# Patient Record
Sex: Female | Born: 1990 | Race: Black or African American | Hispanic: No | Marital: Single | Smoking: Never smoker
Health system: Southern US, Community
[De-identification: ages and names within clinical notes are randomized; demographics above are authoritative.]

---

## 2013-11-06 ENCOUNTER — Emergency Department (HOSPITAL_BASED_OUTPATIENT_CLINIC_OR_DEPARTMENT_OTHER)
Admission: EM | Admit: 2013-11-06 | Discharge: 2013-11-06 | Disposition: A | Discharge status: Home / Self Care | Payer: Self-pay | Attending: Emergency Medicine | Admitting: Emergency Medicine

## 2013-11-06 ENCOUNTER — Encounter (HOSPITAL_BASED_OUTPATIENT_CLINIC_OR_DEPARTMENT_OTHER): Payer: Self-pay | Admitting: Emergency Medicine

## 2013-11-06 ENCOUNTER — Emergency Department (HOSPITAL_BASED_OUTPATIENT_CLINIC_OR_DEPARTMENT_OTHER): Payer: Self-pay

## 2013-11-06 DIAGNOSIS — S6980XA Other specified injuries of unspecified wrist, hand and finger(s), initial encounter: Secondary | ICD-10-CM | POA: Insufficient documentation

## 2013-11-06 DIAGNOSIS — S6990XA Unspecified injury of unspecified wrist, hand and finger(s), initial encounter: Secondary | ICD-10-CM | POA: Insufficient documentation

## 2013-11-06 DIAGNOSIS — S62309A Unspecified fracture of unspecified metacarpal bone, initial encounter for closed fracture: Secondary | ICD-10-CM | POA: Insufficient documentation

## 2013-11-06 MED ORDER — NAPROXEN 500 MG PO TABS: 500.0000 mg | ORAL_TABLET | Freq: Two times a day (BID) | ORAL | Status: DC
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
## 2013-11-06 NOTE — Discharge Instructions (Signed)
Metacarpal Fractures Fractures of metacarpals are breaks in the bones of the hand. They extend from the knuckles to the wrist. These bones can break in many ways. There are different ways of treating these fractures. HOME CARE  Only exercise as told by your doctor.  Return to activities as told by your doctor.  Go to physical therapy as told by your doctor.  Follow your doctor's advice about driving.  Keep the injured hand raised (elevated) above the level of your heart.  If a plaster, fiberglass, or pre-formed splint was applied:  Wear your splint as told and until you are examined again.  Apply ice on the injury for 15-20 minutes at a time, 03-04 times a day. Put the ice in a plastic bag. Place a towel between your skin and the bag.  Do not get your splint or cast wet. Protect it during bathing with a plastic bag.  Loosen the elastic bandage around the splint if your fingers start to get numb, tingle, get cold, or turn blue.  If the splint is plaster, do not lean it on hard surfaces or put pressure on it for 24 hours after it is put on.  Do not  try to scratch the skin under the cast.  Check the skin around the cast every day. You may put lotion on red or sore areas.  Move the fingers of your casted hand several times a day.  Only take medicine as told by your doctor.  Follow up as told by your doctor. This is very important in order to avoid permanent injury, disability, or lasting (chronic) pain. GET HELP RIGHT AWAY IF:   You develop a rash.  You have problems breathing.  You have any allergy problems.  You have more than a small spot of blood from beneath your cast or splint.  You have redness, puffiness (swelling), or more pain from beneath your cast or splint.  Yellowish-white fluid (pus) comes from beneath your cast or splint.  You develop a temperature by mouth above 102 F (38.9 C), not controlled by medicine.  You have a bad smell coming from under your  cast or splint.  You have problems moving any of your fingers. If you do not have a window in your cast for looking at the wound, a fluid or a little bleeding may show up as a stain on the outside of your cast. Tell your doctor about any stains you see. MAKE SURE YOU:   Understand these instructions.  Will watch your condition.  Will get help right away if you are not doing well or get worse. Document Released: 07/23/2007 Document Revised: 06/20/2013 Document Reviewed: 01/09/2009 Exit[employer] Patient Information 2015 [employer]. This information is not intended to replace advice given to you by your health care provider. Make sure you discuss any questions you have with your health care provider.  

## 2014-09-13 ENCOUNTER — Encounter (HOSPITAL_BASED_OUTPATIENT_CLINIC_OR_DEPARTMENT_OTHER): Payer: Self-pay

## 2014-09-13 ENCOUNTER — Emergency Department (HOSPITAL_BASED_OUTPATIENT_CLINIC_OR_DEPARTMENT_OTHER)
Admission: EM | Admit: 2014-09-13 | Discharge: 2014-09-13 | Disposition: A | Discharge status: Home / Self Care | Payer: Self-pay | Attending: Emergency Medicine | Admitting: Emergency Medicine

## 2014-09-13 DIAGNOSIS — Y999 Unspecified external cause status: Secondary | ICD-10-CM | POA: Insufficient documentation

## 2014-09-13 DIAGNOSIS — T7840XA Allergy, unspecified, initial encounter: Secondary | ICD-10-CM | POA: Insufficient documentation

## 2014-09-13 DIAGNOSIS — Y939 Activity, unspecified: Secondary | ICD-10-CM | POA: Insufficient documentation

## 2014-09-13 DIAGNOSIS — Y929 Unspecified place or not applicable: Secondary | ICD-10-CM | POA: Insufficient documentation

## 2014-09-13 DIAGNOSIS — X58XXXA Exposure to other specified factors, initial encounter: Secondary | ICD-10-CM | POA: Insufficient documentation

## 2014-09-13 MED ORDER — DIPHENHYDRAMINE HCL 25 MG PO CAPS: 25.0000 mg | ORAL_CAPSULE | Freq: Four times a day (QID) | ORAL | Status: DC | PRN

## 2014-09-13 MED ORDER — DIPHENHYDRAMINE HCL 25 MG PO CAPS
50.0000 mg | ORAL_CAPSULE | Freq: Once | ORAL | Status: AC
  Administered 2014-09-13: 50 mg via ORAL
  Filled 2014-09-13: qty 2

## 2014-09-13 NOTE — ED Provider Notes (Signed)
CSN: [id number]     Arrival date & time 09/13/14  1424 History   First MD Initiated Contact with Patient 09/13/14 1428     Chief Complaint  Patient presents with  . Rash     (Consider location/radiation/quality/duration/timing/severity/associated sxs/prior Treatment) Patient is a 24 y.o. female presenting with rash. The history is provided by the patient.  Rash Location:  Torso and head/neck Head/neck rash location:  L neck and R neck Torso rash location: under bilateral breast. Quality: itchiness and redness   Severity:  Moderate Onset quality:  Sudden Duration:  2 days Timing:  Intermittent Progression:  Spreading Context: new detergent/soap   Context comment:  Only new thing she can think of is changing laundry detergent Worsened by:  Nothing tried Ineffective treatments:  Topical steroids Associated symptoms: no nausea, no shortness of breath, no throat swelling, no tongue swelling and not wheezing     History reviewed. No pertinent past medical history. History reviewed. No pertinent past surgical history. No family history on file. History  Substance Use Topics  . Smoking status: Never Smoker   . Smokeless tobacco: Not on file  . Alcohol Use: Yes   OB History    No data available     Review of Systems  Respiratory: Negative for shortness of breath and wheezing.   Gastrointestinal: Negative for nausea.  Skin: Positive for rash.  All other systems reviewed and are negative.     Allergies  Review of patient's allergies indicates no known allergies.  Home Medications   Prior to Admission medications   Medication Sig Start Date End Date Taking? Authorizing Provider  diphenhydrAMINE (BENADRYL) 25 mg capsule Take 1 capsule (25 mg total) by mouth every 6 (six) hours as needed. 09/13/14   Gwyneth Sprout, MD   BP 115/64 mmHg  Pulse 86  Temp(Src) 98.9 F (37.2 C) (Oral)  Resp 18  Ht  (1.676 m)  Wt 192 lb (87.091 kg)  BMI 31.00 kg/m2  SpO2 100%  LMP  09/06/2014 Physical Exam  Constitutional: She is oriented to person, place, and time. She appears well-developed and well-nourished. No distress.  HENT:  Head: Normocephalic and atraumatic.  Eyes: EOM are normal. Pupils are equal, round, and reactive to light.  Cardiovascular: Normal rate.   Pulmonary/Chest: Effort normal.  Neurological: She is alert and oriented to person, place, and time.  Skin: Skin is warm and dry. Rash noted. Rash is maculopapular.     Psychiatric: She has a normal mood and affect. Her behavior is normal.  Nursing note and vitals reviewed.   ED Course  Procedures (including critical care time) Labs Review Labs Reviewed - No data to display  Imaging Review No results found.   EKG Interpretation None      MDM   Final diagnoses:  Allergic reaction, initial encounter    Patient presenting with symptoms most consistent with allergic reaction to an unknown substance but could be new laundry detergent. She has a rash and itching underneath bilateral breasts but for the last 2 days has had intermittent itching and rash to her neck. Patient was given Benadryl but this time has no respiratory symptoms and does not appear to need systemic steroids    Gwyneth Sprout, MD 09/13/14 1512

## 2014-09-13 NOTE — ED Notes (Signed)
C/o rash to trunk and posterior neck x 10-15 min-pt NAD

## 2014-09-13 NOTE — Discharge Instructions (Signed)

## 2015-11-12 ENCOUNTER — Encounter (HOSPITAL_BASED_OUTPATIENT_CLINIC_OR_DEPARTMENT_OTHER): Payer: Self-pay | Admitting: *Deleted

## 2015-11-12 ENCOUNTER — Emergency Department (HOSPITAL_BASED_OUTPATIENT_CLINIC_OR_DEPARTMENT_OTHER)
Admission: EM | Admit: 2015-11-12 | Discharge: 2015-11-12 | Disposition: A | Discharge status: Home / Self Care | Payer: Self-pay | Attending: Dermatology | Admitting: Dermatology

## 2015-11-12 DIAGNOSIS — Z5321 Procedure and treatment not carried out due to patient leaving prior to being seen by health care provider: Secondary | ICD-10-CM | POA: Insufficient documentation

## 2015-11-12 DIAGNOSIS — N898 Other specified noninflammatory disorders of vagina: Secondary | ICD-10-CM | POA: Insufficient documentation

## 2015-11-12 LAB — URINALYSIS, ROUTINE W REFLEX MICROSCOPIC
BILIRUBIN URINE: NEGATIVE
Glucose, UA: NEGATIVE mg/dL
Hgb urine dipstick: NEGATIVE
KETONES UR: NEGATIVE mg/dL
Leukocytes, UA: NEGATIVE
NITRITE: NEGATIVE
PH: 7 (ref 5.0–8.0)
Protein, ur: NEGATIVE mg/dL
Specific Gravity, Urine: 1.008 (ref 1.005–1.030)

## 2015-11-12 LAB — PREGNANCY, URINE: Preg Test, Ur: NEGATIVE

## 2015-11-12 NOTE — ED Triage Notes (Signed)
Vaginal discharge x 1 week.  

## 2015-11-13 ENCOUNTER — Encounter (HOSPITAL_BASED_OUTPATIENT_CLINIC_OR_DEPARTMENT_OTHER): Payer: Self-pay | Admitting: *Deleted

## 2015-11-13 ENCOUNTER — Emergency Department (HOSPITAL_BASED_OUTPATIENT_CLINIC_OR_DEPARTMENT_OTHER)
Admission: EM | Admit: 2015-11-13 | Discharge: 2015-11-14 | Disposition: A | Discharge status: Home / Self Care | Payer: Self-pay | Attending: Emergency Medicine | Admitting: Emergency Medicine

## 2015-11-13 DIAGNOSIS — N760 Acute vaginitis: Secondary | ICD-10-CM | POA: Insufficient documentation

## 2015-11-13 DIAGNOSIS — N898 Other specified noninflammatory disorders of vagina: Secondary | ICD-10-CM

## 2015-11-13 DIAGNOSIS — B9689 Other specified bacterial agents as the cause of diseases classified elsewhere: Secondary | ICD-10-CM

## 2015-11-13 LAB — WET PREP, GENITAL
Sperm: NONE SEEN
TRICH WET PREP: NONE SEEN
Yeast Wet Prep HPF POC: NONE SEEN

## 2015-11-13 LAB — PREGNANCY, URINE: PREG TEST UR: NEGATIVE

## 2015-11-13 NOTE — ED Triage Notes (Signed)
Vaginal discharge x 1 week.  
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
## 2015-11-14 MED ORDER — AZITHROMYCIN 1 G PO PACK
1.0000 g | PACK | Freq: Once | ORAL | Status: AC
  Administered 2015-11-14: 1 g via ORAL
  Filled 2015-11-14: qty 1

## 2015-11-14 MED ORDER — CEFTRIAXONE SODIUM 250 MG IJ SOLR
250.0000 mg | Freq: Once | INTRAMUSCULAR | Status: AC
  Administered 2015-11-14: 250 mg via INTRAMUSCULAR
  Filled 2015-11-14: qty 250

## 2015-11-14 MED ORDER — METRONIDAZOLE 500 MG PO TABS: 500.0000 mg | ORAL_TABLET | Freq: Two times a day (BID) | ORAL | 0 refills | Status: DC

## 2015-11-14 NOTE — ED Notes (Signed)
Pt verbalizes understanding of d/c instructions and denies any further needs at this time. 

## 2015-11-14 NOTE — Discharge Instructions (Signed)
You were treated for gonorrhea and chlamydia in the ED. Your gonorrhea, chlamydia, HIV, syphilis test are still pending. If your test results come back positive you must inform all your sexual partners. You are being treated for bacterial vaginosis with Flagyl. Take the Flagyl as prescribed and be sure to complete the entire seven-day course. Do not drink alcohol with this medication as it will cause vomiting. Use another form of birth control such as condoms because you were treated with antibiotics. Some antibiotics can decrease the effectiveness of hormonal birth control methods. Use condoms at all times for sexual intercourse to prevent STDs. [facility] your primary care provider or [facility] for further [facility]D testing. Return immediately to the emergency department if you experience fevers, abdominal pain, worsening vaginal discharge, pain with intercourse, or any other concerning symptoms.

## 2015-11-15 LAB — HIV ANTIBODY (ROUTINE TESTING W REFLEX): HIV Screen 4th Generation wRfx: NONREACTIVE

## 2015-11-15 LAB — GC/CHLAMYDIA PROBE AMP (~~LOC~~) NOT AT ARMC
CHLAMYDIA, DNA PROBE: NEGATIVE
Neisseria Gonorrhea: NEGATIVE

## 2015-11-15 LAB — RPR: RPR Ser Ql: NONREACTIVE

## 2016-04-27 ENCOUNTER — Emergency Department (HOSPITAL_COMMUNITY): Payer: No Typology Code available for payment source

## 2016-04-27 ENCOUNTER — Encounter (HOSPITAL_COMMUNITY): Payer: Self-pay | Admitting: Radiology

## 2016-04-27 ENCOUNTER — Emergency Department (HOSPITAL_COMMUNITY)
Admission: EM | Admit: 2016-04-27 | Discharge: 2016-04-27 | Disposition: A | Discharge status: Home / Self Care | Payer: No Typology Code available for payment source | Attending: Emergency Medicine | Admitting: Emergency Medicine

## 2016-04-27 DIAGNOSIS — S1091XA Abrasion of unspecified part of neck, initial encounter: Secondary | ICD-10-CM | POA: Insufficient documentation

## 2016-04-27 DIAGNOSIS — T148XXA Other injury of unspecified body region, initial encounter: Secondary | ICD-10-CM

## 2016-04-27 DIAGNOSIS — S0083XA Contusion of other part of head, initial encounter: Secondary | ICD-10-CM | POA: Insufficient documentation

## 2016-04-27 DIAGNOSIS — Y9389 Activity, other specified: Secondary | ICD-10-CM | POA: Insufficient documentation

## 2016-04-27 DIAGNOSIS — S80212A Abrasion, left knee, initial encounter: Secondary | ICD-10-CM | POA: Diagnosis not present

## 2016-04-27 DIAGNOSIS — Y999 Unspecified external cause status: Secondary | ICD-10-CM | POA: Insufficient documentation

## 2016-04-27 DIAGNOSIS — M791 Myalgia, unspecified site: Secondary | ICD-10-CM

## 2016-04-27 DIAGNOSIS — Y92410 Unspecified street and highway as the place of occurrence of the external cause: Secondary | ICD-10-CM | POA: Insufficient documentation

## 2016-04-27 DIAGNOSIS — R931 Abnormal findings on diagnostic imaging of heart and coronary circulation: Secondary | ICD-10-CM | POA: Diagnosis not present

## 2016-04-27 DIAGNOSIS — S0990XA Unspecified injury of head, initial encounter: Secondary | ICD-10-CM | POA: Diagnosis present

## 2016-04-27 DIAGNOSIS — R935 Abnormal findings on diagnostic imaging of other abdominal regions, including retroperitoneum: Secondary | ICD-10-CM | POA: Insufficient documentation

## 2016-04-27 LAB — I-STAT CHEM 8, ED
BUN: 12 mg/dL (ref 6–20)
CHLORIDE: 107 mmol/L (ref 101–111)
CREATININE: 1.1 mg/dL — AB (ref 0.44–1.00)
Calcium, Ion: 1.11 mmol/L — ABNORMAL LOW (ref 1.15–1.40)
GLUCOSE: 116 mg/dL — AB (ref 65–99)
HCT: 41 % (ref 36.0–46.0)
Hemoglobin: 13.9 g/dL (ref 12.0–15.0)
Potassium: 3.7 mmol/L (ref 3.5–5.1)
Sodium: 141 mmol/L (ref 135–145)
TCO2: 24 mmol/L (ref 0–100)

## 2016-04-27 LAB — COMPREHENSIVE METABOLIC PANEL
ALK PHOS: 87 U/L (ref 38–126)
ALT: 21 U/L (ref 14–54)
ANION GAP: 10 (ref 5–15)
AST: 25 U/L (ref 15–41)
Albumin: 4.1 g/dL (ref 3.5–5.0)
BILIRUBIN TOTAL: 0.6 mg/dL (ref 0.3–1.2)
BUN: 10 mg/dL (ref 6–20)
CALCIUM: 9.3 mg/dL (ref 8.9–10.3)
CO2: 24 mmol/L (ref 22–32)
CREATININE: 0.97 mg/dL (ref 0.44–1.00)
Chloride: 105 mmol/L (ref 101–111)
Glucose, Bld: 115 mg/dL — ABNORMAL HIGH (ref 65–99)
Potassium: 3.4 mmol/L — ABNORMAL LOW (ref 3.5–5.1)
SODIUM: 139 mmol/L (ref 135–145)
TOTAL PROTEIN: 8.2 g/dL — AB (ref 6.5–8.1)

## 2016-04-27 LAB — URINALYSIS, ROUTINE W REFLEX MICROSCOPIC
BILIRUBIN URINE: NEGATIVE
GLUCOSE, UA: NEGATIVE mg/dL
Hgb urine dipstick: NEGATIVE
KETONES UR: NEGATIVE mg/dL
NITRITE: NEGATIVE
PH: 6 (ref 5.0–8.0)
Protein, ur: NEGATIVE mg/dL
SPECIFIC GRAVITY, URINE: 1.006 (ref 1.005–1.030)

## 2016-04-27 LAB — SAMPLE TO BLOOD BANK

## 2016-04-27 LAB — CBC
HCT: 39.1 % (ref 36.0–46.0)
HEMOGLOBIN: 12.8 g/dL (ref 12.0–15.0)
MCH: 26.8 pg (ref 26.0–34.0)
MCHC: 32.7 g/dL (ref 30.0–36.0)
MCV: 82 fL (ref 78.0–100.0)
PLATELETS: 309 10*3/uL (ref 150–400)
RBC: 4.77 MIL/uL (ref 3.87–5.11)
RDW: 14.2 % (ref 11.5–15.5)
WBC: 8.9 10*3/uL (ref 4.0–10.5)

## 2016-04-27 LAB — I-STAT BETA HCG BLOOD, ED (MC, WL, AP ONLY)

## 2016-04-27 LAB — ETHANOL: ALCOHOL ETHYL (B): 117 mg/dL — AB (ref <5)

## 2016-04-27 LAB — PROTIME-INR
INR: 1.06
Prothrombin Time: 13.9 seconds (ref 11.4–15.2)

## 2016-04-27 LAB — CDS SEROLOGY

## 2016-04-27 MED ORDER — SODIUM CHLORIDE 0.9 % IV SOLN: INTRAVENOUS | Status: DC

## 2016-04-27 MED ORDER — SODIUM CHLORIDE 0.9 % IV BOLUS (SEPSIS)
1000.0000 mL | Freq: Once | INTRAVENOUS | Status: AC
  Administered 2016-04-27: 1000 mL via INTRAVENOUS

## 2016-04-27 MED ORDER — IOPAMIDOL (ISOVUE-300) INJECTION 61%
INTRAVENOUS | Status: AC
  Administered 2016-04-27: 100 mL
  Filled 2016-04-27: qty 100

## 2016-04-27 MED ORDER — TETANUS-DIPHTH-ACELL PERTUSSIS 5-2.5-18.5 LF-MCG/0.5 IM SUSP: 0.5000 mL | Freq: Once | INTRAMUSCULAR | Status: DC
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
## 2016-04-27 NOTE — ED Notes (Signed)
C - collar removed by Shanna CiscoJamie Ward, PA. Pt ambulatory around room and hallway with independent steady gait. Pt states she feels fine and would like to be discharged when ready.

## 2016-04-27 NOTE — ED Notes (Signed)
Pt A&OX4, ambulatory at d/c with steady gait, NAD and verbalized understanding of d/c instructions and follow up care.  
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
## 2016-04-27 NOTE — Discharge Instructions (Signed)
Alternate between tylenol and ibuprofen as needed for pain.  Ice affected areas for additional pain relief. Warm epson salt baths also aid in muscle soreness.  Please follow up with your primary care provider.  Return to ER for new or worsening symptoms, any additional concerns.

## 2016-04-28 ENCOUNTER — Encounter (HOSPITAL_BASED_OUTPATIENT_CLINIC_OR_DEPARTMENT_OTHER): Payer: Self-pay | Admitting: *Deleted

## 2016-04-28 LAB — I-STAT CG4 LACTIC ACID, ED: Lactic Acid, Venous: 1.65 mmol/L (ref 0.5–1.9)

## 2016-07-28 ENCOUNTER — Emergency Department (HOSPITAL_BASED_OUTPATIENT_CLINIC_OR_DEPARTMENT_OTHER)
Admission: EM | Admit: 2016-07-28 | Discharge: 2016-07-28 | Disposition: A | Discharge status: Home / Self Care | Payer: Self-pay | Attending: Emergency Medicine | Admitting: Emergency Medicine

## 2016-07-28 ENCOUNTER — Encounter (HOSPITAL_BASED_OUTPATIENT_CLINIC_OR_DEPARTMENT_OTHER): Payer: Self-pay

## 2016-07-28 DIAGNOSIS — N739 Female pelvic inflammatory disease, unspecified: Secondary | ICD-10-CM | POA: Insufficient documentation

## 2016-07-28 DIAGNOSIS — Z87891 Personal history of nicotine dependence: Secondary | ICD-10-CM | POA: Insufficient documentation

## 2016-07-28 DIAGNOSIS — N898 Other specified noninflammatory disorders of vagina: Secondary | ICD-10-CM | POA: Insufficient documentation

## 2016-07-28 DIAGNOSIS — N760 Acute vaginitis: Secondary | ICD-10-CM | POA: Insufficient documentation

## 2016-07-28 DIAGNOSIS — B9689 Other specified bacterial agents as the cause of diseases classified elsewhere: Secondary | ICD-10-CM

## 2016-07-28 LAB — WET PREP, GENITAL
SPERM: NONE SEEN
TRICH WET PREP: NONE SEEN
Yeast Wet Prep HPF POC: NONE SEEN

## 2016-07-28 LAB — URINALYSIS, ROUTINE W REFLEX MICROSCOPIC
BILIRUBIN URINE: NEGATIVE
Glucose, UA: NEGATIVE mg/dL
Hgb urine dipstick: NEGATIVE
Ketones, ur: NEGATIVE mg/dL
Leukocytes, UA: NEGATIVE
Nitrite: NEGATIVE
PH: 7.5 (ref 5.0–8.0)
Protein, ur: NEGATIVE mg/dL
SPECIFIC GRAVITY, URINE: 1.02 (ref 1.005–1.030)

## 2016-07-28 LAB — PREGNANCY, URINE: Preg Test, Ur: NEGATIVE

## 2016-07-28 MED ORDER — LIDOCAINE HCL (PF) 1 % IJ SOLN
INTRAMUSCULAR | Status: AC
  Administered 2016-07-28: 1 mL
  Filled 2016-07-28: qty 5

## 2016-07-28 MED ORDER — CEFTRIAXONE SODIUM 250 MG IJ SOLR
250.0000 mg | Freq: Once | INTRAMUSCULAR | Status: AC
  Administered 2016-07-28: 250 mg via INTRAMUSCULAR
  Filled 2016-07-28: qty 250

## 2016-07-28 MED ORDER — DOXYCYCLINE HYCLATE 100 MG PO TABS: 100.0000 mg | ORAL_TABLET | Freq: Two times a day (BID) | ORAL | 0 refills | Status: DC

## 2016-07-28 MED ORDER — METRONIDAZOLE 500 MG PO TABS: 500.0000 mg | ORAL_TABLET | Freq: Two times a day (BID) | ORAL | 0 refills | Status: AC

## 2016-07-28 NOTE — ED Notes (Signed)
ED Provider at bedside. 

## 2016-07-28 NOTE — ED Triage Notes (Signed)
C/o dysuria x today-NAD-steady gait 

## 2016-07-28 NOTE — ED Provider Notes (Signed)
[facility]   CSN: [id number] Arrival date & time: 07/28/16  2010     History   Chief Complaint Chief Complaint  Patient presents with  . Dysuria  . Vaginal Discharge    HPI Rebecca Pitts is a 26 y.o. female.  HPI   26 year old female with no significant PMH presenting with dysuria. Reports burning with urination for past 2 days that significantly worsened today. She endorses urinary frequency and suprapubic pain. Denies fevers and back pain. Has had increased vaginal discharge and vaginal irritation. Is sexually active and had sexual intercourse with a new partner last week without condoms.   History reviewed. No pertinent past medical history.  There are no active problems to display for this patient.   History reviewed. No pertinent surgical history.  OB History    No data available       Home Medications    Prior to Admission medications   Medication Sig Start Date End Date Taking? Authorizing Provider  doxycycline (VIBRA-TABS) 100 MG tablet Take 1 tablet (100 mg total) by mouth 2 (two) times daily. 07/28/16   Arvilla Market, DO  metroNIDAZOLE (FLAGYL) 500 MG tablet Take 1 tablet (500 mg total) by mouth 2 (two) times daily. 07/28/16 08/11/16  Arvilla Market, DO    Family History No family history on file.  Social History Social History  Substance Use Topics  . Smoking status: Former Games developer  . Smokeless tobacco: Never Used  . Alcohol use Yes     Comment: occ     Allergies   Patient has no known allergies.   Review of Systems Review of Systems  Constitutional: Negative for activity change, chills and fever.  HENT: Negative for congestion.   Respiratory: Negative for cough and shortness of breath.   Cardiovascular: Negative for chest pain.  Gastrointestinal: Positive for abdominal pain. Negative for abdominal distention, constipation, diarrhea, nausea and vomiting.  Genitourinary: Positive for  dysuria, frequency, vaginal discharge and vaginal pain. Negative for flank pain and hematuria.     Physical Exam Updated Vital Signs BP 99/64 (BP Location: Left Arm)   Pulse 80   Temp 98.5 F (36.9 C) (Oral)   Resp 20   Ht 5\' 6"  (1.676 m)   Wt 90.7 kg (200 lb)   LMP 07/12/2016   SpO2 100%   BMI 32.28 kg/m   Physical Exam  Constitutional: She appears well-developed and well-nourished. No distress.  HENT:  Head: Normocephalic and atraumatic.  Mouth/Throat: Oropharynx is clear and moist.  Eyes: Conjunctivae and EOM are normal.  Neck: Normal range of motion. Neck supple.  Cardiovascular: Normal rate, regular rhythm and normal heart sounds.   No murmur heard. Pulmonary/Chest: Effort normal and breath sounds normal. No respiratory distress.  Abdominal: Soft. Bowel sounds are normal. She exhibits no distension. There is no tenderness. There is no rebound and no guarding.  No CVA tenderness.   Genitourinary: Uterus normal. Cervix exhibits motion tenderness and discharge.  Genitourinary Comments: Thin white-yellow discharge present from cerivcal os and in vaginal vault. +Whiff test.      ED Treatments / Results  Labs (all labs ordered are listed, but only abnormal results are displayed) Labs Reviewed  WET PREP, GENITAL - Abnormal; Notable for the following:       Result Value   Clue Cells Wet Prep HPF POC PRESENT (*)    WBC, Wet Prep HPF POC MANY (*)    All other components within normal limits  URINALYSIS, ROUTINE W REFLEX MICROSCOPIC - Abnormal; Notable for the following:    APPearance CLOUDY (*)    All other components within normal limits  PREGNANCY, URINE  GC/CHLAMYDIA PROBE AMP ([place]    EKG  EKG Interpretation None       Radiology No results found.  Procedures Procedures (including critical care time)  Medications Ordered in ED Medications  cefTRIAXone (ROCEPHIN) injection 250 mg (250 mg Intramuscular Given 07/28/16 2147)  lidocaine  (PF) (XYLOCAINE) 1 % injection (1 mL  Given 07/28/16 2206)     Initial Impression / Assessment and Plan / ED Course  I have reviewed the triage vital signs and the nursing notes.  Pertinent labs & imaging results that were available during my care of the patient were reviewed by me and considered in my medical decision making (see chart for details).    26 year old female presenting with dysuria, frequency, suprapubic pain and vaginal discharge. Patient afebrile with stable vital signs. Abdominal exam benign. UA negative for infection and urine pregnancy negative. Pelvic exam performed which revealed malodorous vaginal discharge and cervical motion tenderness. Given recent unprotected sexual intercourse with new partner as well as exam findings, will treat for PID. CTX 250 mg IM given in ED. Rx for 7 day course of Doxycyline provided. Wet prep significant for clue cells and WBCs. Rx for 7 day course of Flagyl provided to treat Bacterial Vaginosis. GC/Chlamydia cultures obtained and pending. Return precautions discussed.   Final Clinical Impressions(s) / ED Diagnoses   Final diagnoses:  BV (bacterial vaginosis)  Pelvic inflammatory disease (PID)    New Prescriptions New Prescriptions   DOXYCYCLINE (VIBRA-TABS) 100 MG TABLET    Take 1 tablet (100 mg total) by mouth 2 (two) times daily.   METRONIDAZOLE (FLAGYL) 500 MG TABLET    Take 1 tablet (500 mg total) by mouth 2 (two) times daily.     Arvilla MarketWallace, Catherine Lauren, DO 07/28/16 2210    Maia PlanLong, Joshua G, MD 07/29/16 1014

## 2016-07-29 LAB — GC/CHLAMYDIA PROBE AMP (~~LOC~~) NOT AT ARMC
Chlamydia: NEGATIVE
NEISSERIA GONORRHEA: NEGATIVE

## 2017-01-19 ENCOUNTER — Emergency Department (HOSPITAL_BASED_OUTPATIENT_CLINIC_OR_DEPARTMENT_OTHER)
Admission: EM | Admit: 2017-01-19 | Discharge: 2017-01-19 | Disposition: A | Discharge status: Home / Self Care | Payer: Self-pay | Attending: Emergency Medicine | Admitting: Emergency Medicine

## 2017-01-19 ENCOUNTER — Encounter (HOSPITAL_BASED_OUTPATIENT_CLINIC_OR_DEPARTMENT_OTHER): Payer: Self-pay | Admitting: Emergency Medicine

## 2017-01-19 ENCOUNTER — Other Ambulatory Visit: Payer: Self-pay

## 2017-01-19 DIAGNOSIS — R350 Frequency of micturition: Secondary | ICD-10-CM | POA: Insufficient documentation

## 2017-01-19 DIAGNOSIS — N760 Acute vaginitis: Secondary | ICD-10-CM | POA: Insufficient documentation

## 2017-01-19 DIAGNOSIS — Z87891 Personal history of nicotine dependence: Secondary | ICD-10-CM | POA: Insufficient documentation

## 2017-01-19 DIAGNOSIS — B9689 Other specified bacterial agents as the cause of diseases classified elsewhere: Secondary | ICD-10-CM | POA: Insufficient documentation

## 2017-01-19 DIAGNOSIS — N898 Other specified noninflammatory disorders of vagina: Secondary | ICD-10-CM | POA: Insufficient documentation

## 2017-01-19 DIAGNOSIS — R300 Dysuria: Secondary | ICD-10-CM | POA: Insufficient documentation

## 2017-01-19 LAB — URINALYSIS, ROUTINE W REFLEX MICROSCOPIC
BILIRUBIN URINE: NEGATIVE
GLUCOSE, UA: NEGATIVE mg/dL
Hgb urine dipstick: NEGATIVE
KETONES UR: NEGATIVE mg/dL
Leukocytes, UA: NEGATIVE
Nitrite: NEGATIVE
Protein, ur: NEGATIVE mg/dL
Specific Gravity, Urine: >1.03 — ABNORMAL HIGH (ref 1.005–1.030)
pH: 5.5 (ref 5.0–8.0)

## 2017-01-19 LAB — PREGNANCY, URINE: Preg Test, Ur: NEGATIVE

## 2017-01-19 LAB — WET PREP, GENITAL
SPERM: NONE SEEN
TRICH WET PREP: NONE SEEN
Yeast Wet Prep HPF POC: NONE SEEN

## 2017-01-19 MED ORDER — CEFTRIAXONE SODIUM 250 MG IJ SOLR
250.0000 mg | Freq: Once | INTRAMUSCULAR | Status: AC
  Administered 2017-01-19: 250 mg via INTRAMUSCULAR
  Filled 2017-01-19: qty 250

## 2017-01-19 MED ORDER — AZITHROMYCIN 250 MG PO TABS
1000.0000 mg | ORAL_TABLET | Freq: Once | ORAL | Status: AC
  Administered 2017-01-19: 1000 mg via ORAL
  Filled 2017-01-19: qty 4

## 2017-01-19 MED ORDER — METRONIDAZOLE 500 MG PO TABS: 500.0000 mg | ORAL_TABLET | Freq: Two times a day (BID) | ORAL | 0 refills | Status: DC

## 2017-01-19 NOTE — Discharge Instructions (Signed)
Please follow-up your results on MyChart. We will call for positive test results as well.  Please take antibiotics for Bacterial vaginosis. You were also treated for possible gonorrhea and chlamydia as well, but your test results are pending. Please abstain from sexual intercourse for one week to let infection clear. If you do have sex, please use condoms.  Please return without fail for worsening symptoms, including abdominal pain, vomiting, fever, or any other symptoms concerning to you

## 2017-01-19 NOTE — ED Triage Notes (Signed)
"   I have been having slight discharge x 1 week" Had unprotected sex, brownish in color, no odor. Dysuria today, denies abd pain

## 2017-01-19 NOTE — ED Provider Notes (Signed)
[facility] EMERGENCY DEPARTMENT Provider Note   CSN: [id number] Arrival date & time: 01/19/17  [id number]     History   Chief Complaint Chief Complaint  Patient presents with  . Vaginal Discharge    HPI Rebecca Pitts is a 26 y.o. female.  The history is provided by the patient.  Vaginal Discharge   This is a new problem. The current episode started more than 1 week ago. The problem occurs constantly. The problem has not changed since onset.The discharge occurs spontaneously. The discharge was dark. Associated symptoms include dysuria and frequency. Pertinent negatives include no diaphoresis, no fever, no abdominal swelling, no abdominal pain, no diarrhea, no nausea, no vomiting, no genital burning, no genital itching and no genital lesions. She has tried acetaminophen for the symptoms.   26 year old female who presents with vaginal discharge times 1 week.  Reports unprotected sexual intercourse with one partner for quite some time, but is concerned about potential STD exposures.  States that she had brownish colored discharge over the past week.  This morning had dysuria with urinary frequency.  Denies nausea, vomiting, diarrhea, abdominal pain, flank pain, fevers or chills.   History reviewed. No pertinent past medical history.  There are no active problems to display for this patient.   History reviewed. No pertinent surgical history.  OB History    No data available       Home Medications    Prior to Admission medications   Medication Sig Start Date End Date Taking? Authorizing Provider  doxycycline (VIBRA-TABS) 100 MG tablet Take 1 tablet (100 mg total) by mouth 2 (two) times daily. 07/28/16   Arvilla MarketWallace, Catherine Lauren, DO  metroNIDAZOLE (FLAGYL) 500 MG tablet Take 1 tablet (500 mg total) by mouth 2 (two) times daily. 01/19/17   Lavera GuiseLiu, Antoinett Dorman Duo, MD    Family History No family history on file.  Social History Social History   Tobacco Use  . Smoking  status: Former Games developermoker  . Smokeless tobacco: Never Used  Substance Use Topics  . Alcohol use: Yes    Comment: occ  . Drug use: No     Allergies   Patient has no known allergies.   Review of Systems Review of Systems  Constitutional: Negative for diaphoresis and fever.  Gastrointestinal: Negative for abdominal pain, diarrhea, nausea and vomiting.  Genitourinary: Positive for dysuria, frequency and vaginal discharge.  All other systems reviewed and are negative.    Physical Exam Updated Vital Signs Ht 5\' 6"  (1.676 m)   Wt 87.1 kg (192 lb)   LMP 12/26/2016 (Exact Date)   BMI 30.99 kg/m   Physical Exam Physical Exam  Nursing note and vitals reviewed. Constitutional: Well developed, well nourished, non-toxic, and in no acute distress Head: Normocephalic and atraumatic.  Mouth/Throat: Oropharynx is clear and moist.  Neck: Normal range of motion. Neck supple.  Cardiovascular: Normal rate and regular rhythm.   Pulmonary/Chest: Effort normal and breath sounds normal.  Abdominal: Soft. There is no tenderness. There is no rebound and no guarding.  Musculoskeletal: Normal range of motion.  Neurological: Alert, no facial droop, fluent speech, moves all extremities symmetrically Skin: Skin is warm and dry.  Psychiatric: Cooperative Pelvic: Normal external genitalia. Normal internal genitalia. White vaginal discharge. No blood within the vagina. No cervical motion tenderness. No adnexal masses or tenderness.   ED Treatments / Results  Labs (all labs ordered are listed, but only abnormal results are displayed) Labs Reviewed  WET PREP, GENITAL - Abnormal; Notable for the  following components:      Result Value   Clue Cells Wet Prep HPF POC PRESENT (*)    WBC, Wet Prep HPF POC MODERATE (*)    All other components within normal limits  URINALYSIS, ROUTINE W REFLEX MICROSCOPIC - Abnormal; Notable for the following components:   Specific Gravity, Urine >1.030 (*)    All other  components within normal limits  URINE CULTURE  PREGNANCY, URINE  HIV ANTIBODY (ROUTINE TESTING)  RPR  GC/CHLAMYDIA PROBE AMP ([place]) NOT AT [facility]    EKG  EKG Interpretation None       Radiology No results found.  Procedures Procedures (including critical care time)  Medications Ordered in ED Medications  azithromycin (ZITHROMAX) tablet 1,000 mg (not administered)  cefTRIAXone (ROCEPHIN) injection 250 mg (not administered)     Initial Impression / Assessment and Plan / ED Course  I have reviewed the triage vital signs and the nursing notes.  Pertinent labs & imaging results that were available during my care of the patient were reviewed by me and considered in my medical decision making (see chart for details).     26 year old female who presents with vaginal discharge and dysuria.  She is well-appearing in no acute distress.  She has soft benign abdomen.  No concerning features on pelvic exam to suggest PID or TOA.  She does want to be empirically treated for STDs, and given ceftriaxone and azithromycin.  Wet prep is notable for BV for which she will be treated with course of Flagyl.  UA without signs of UTI.  She is not pregnant. Safe sex counseling provided. Strict return and follow-up instructions reviewed. She expressed understanding of all discharge instructions and felt comfortable with the plan of care.   Final Clinical Impressions(s) / ED Diagnoses   Final diagnoses:  Vaginal discharge  BV (bacterial vaginosis)    ED Discharge Orders        Ordered    metroNIDAZOLE (FLAGYL) 500 MG tablet  2 times daily     01/19/17 0931       Lavera GuiseLiu, Chalet Kerwin Duo, MD 01/19/17 [id number]

## 2017-01-20 LAB — URINE CULTURE: Culture: NO GROWTH

## 2017-01-20 LAB — RPR: RPR Ser Ql: NONREACTIVE

## 2017-01-20 LAB — HIV ANTIBODY (ROUTINE TESTING W REFLEX): HIV Screen 4th Generation wRfx: NONREACTIVE

## 2017-01-20 LAB — GC/CHLAMYDIA PROBE AMP (~~LOC~~) NOT AT ARMC
CHLAMYDIA, DNA PROBE: NEGATIVE
NEISSERIA GONORRHEA: NEGATIVE

## 2017-03-31 ENCOUNTER — Encounter (HOSPITAL_BASED_OUTPATIENT_CLINIC_OR_DEPARTMENT_OTHER): Payer: Self-pay | Admitting: *Deleted

## 2017-03-31 ENCOUNTER — Other Ambulatory Visit: Payer: Self-pay

## 2017-03-31 ENCOUNTER — Emergency Department (HOSPITAL_BASED_OUTPATIENT_CLINIC_OR_DEPARTMENT_OTHER)
Admission: EM | Admit: 2017-03-31 | Discharge: 2017-04-01 | Disposition: A | Discharge status: Home / Self Care | Payer: Self-pay | Attending: Emergency Medicine | Admitting: Emergency Medicine

## 2017-03-31 DIAGNOSIS — N760 Acute vaginitis: Secondary | ICD-10-CM | POA: Insufficient documentation

## 2017-03-31 DIAGNOSIS — B9689 Other specified bacterial agents as the cause of diseases classified elsewhere: Secondary | ICD-10-CM

## 2017-03-31 LAB — URINALYSIS, ROUTINE W REFLEX MICROSCOPIC
Bilirubin Urine: NEGATIVE
GLUCOSE, UA: NEGATIVE mg/dL
Hgb urine dipstick: NEGATIVE
KETONES UR: NEGATIVE mg/dL
LEUKOCYTES UA: NEGATIVE
Nitrite: NEGATIVE
PH: 6.5 (ref 5.0–8.0)
Protein, ur: NEGATIVE mg/dL
Specific Gravity, Urine: 1.02 (ref 1.005–1.030)

## 2017-03-31 LAB — WET PREP, GENITAL
SPERM: NONE SEEN
TRICH WET PREP: NONE SEEN
Yeast Wet Prep HPF POC: NONE SEEN

## 2017-03-31 LAB — PREGNANCY, URINE: Preg Test, Ur: NEGATIVE

## 2017-03-31 NOTE — ED Triage Notes (Signed)
Dysuria and frequency since yesterday.  

## 2017-04-01 ENCOUNTER — Encounter (HOSPITAL_BASED_OUTPATIENT_CLINIC_OR_DEPARTMENT_OTHER): Payer: Self-pay | Admitting: Emergency Medicine

## 2017-04-01 MED ORDER — METRONIDAZOLE 500 MG PO TABS: 500.0000 mg | ORAL_TABLET | Freq: Two times a day (BID) | ORAL | 0 refills | Status: DC

## 2017-04-01 MED ORDER — METRONIDAZOLE 500 MG PO TABS
500.0000 mg | ORAL_TABLET | Freq: Once | ORAL | Status: AC
  Administered 2017-04-01: 500 mg via ORAL
  Filled 2017-04-01: qty 1

## 2017-04-01 NOTE — ED Notes (Signed)
Pt understood dc material. NAD noted. Scripts given at dc 

## 2017-04-01 NOTE — ED Provider Notes (Signed)
[facility] EMERGENCY DEPARTMENT Provider Note   CSN: [id number] Arrival date & time: 03/31/17  2248     History   Chief Complaint Chief Complaint  Patient presents with  . Dysuria    HPI Rebecca Pitts is a 27 y.o. female.  The history is provided by the patient.  Dysuria   This is a new problem. The current episode started more than 2 days ago. The problem occurs every urination. The problem has not changed since onset.The quality of the pain is described as burning. The pain is moderate. There has been no fever. She is sexually active. There is no history of pyelonephritis. Associated symptoms include discharge. Pertinent negatives include no urgency and no flank pain. She has tried nothing for the symptoms. Her past medical history does not include kidney stones.  Same partner but unprotected encounters.  White discharge.    History reviewed. No pertinent past medical history.  There are no active problems to display for this patient.   History reviewed. No pertinent surgical history.  OB History    No data available       Home Medications    Prior to Admission medications   Medication Sig Start Date End Date Taking? Authorizing Provider  doxycycline (VIBRA-TABS) 100 MG tablet Take 1 tablet (100 mg total) by mouth 2 (two) times daily. 07/28/16   Arvilla MarketWallace, Catherine Lauren, DO  metroNIDAZOLE (FLAGYL) 500 MG tablet Take 1 tablet (500 mg total) by mouth 2 (two) times daily. 01/19/17   Lavera GuiseLiu, Dana Duo, MD  metroNIDAZOLE (FLAGYL) 500 MG tablet Take 1 tablet (500 mg total) by mouth 2 (two) times daily. One po bid x 7 days 04/01/17   Cy BlamerPalumbo, Karter Hellmer, MD    Family History No family history on file.  Social History Social History   Tobacco Use  . Smoking status: Never Smoker  . Smokeless tobacco: Never Used  Substance Use Topics  . Alcohol use: Yes    Comment: occ  . Drug use: No     Allergies   Patient has no known allergies.   Review of  Systems Review of Systems  Constitutional: Negative for fever.  Respiratory: Negative for shortness of breath.   Cardiovascular: Negative for chest pain.  Gastrointestinal: Negative for abdominal pain.  Genitourinary: Positive for dysuria and vaginal discharge. Negative for flank pain and urgency.  All other systems reviewed and are negative.    Physical Exam Updated Vital Signs BP 136/89 (BP Location: Right Arm)   Pulse 80   Temp 98.1 F (36.7 C) (Oral)   Resp 18   Ht 5\' 6"  (1.676 m)   Wt 86.2 kg (190 lb)   LMP 03/15/2017   SpO2 100%   BMI 30.67 kg/m   Physical Exam  Constitutional: She is oriented to person, place, and time. She appears well-developed and well-nourished. No distress.  HENT:  Head: Normocephalic and atraumatic.  Mouth/Throat: No oropharyngeal exudate.  Eyes: Conjunctivae are normal. Pupils are equal, round, and reactive to light.  Neck: Normal range of motion. Neck supple.  Cardiovascular: Normal rate, regular rhythm, normal heart sounds and intact distal pulses.  Pulmonary/Chest: Effort normal and breath sounds normal. No stridor. She has no wheezes. She has no rales.  Abdominal: Soft. Bowel sounds are normal. She exhibits no mass. There is no tenderness. There is no rebound and no guarding.  Genitourinary: Vaginal discharge found.  Genitourinary Comments: Chaperone present scant white discharge no cmt or tenderness  Musculoskeletal: Normal range of motion.  Neurological: She is alert and oriented to person, place, and time. She displays normal reflexes.  Skin: Skin is warm and dry. Capillary refill takes less than 2 seconds.  Psychiatric: She has a normal mood and affect.     ED Treatments / Results  Labs (all labs ordered are listed, but only abnormal results are displayed) Labs Reviewed  WET PREP, GENITAL - Abnormal; Notable for the following components:      Result Value   Clue Cells Wet Prep HPF POC PRESENT (*)    WBC, Wet Prep HPF POC  MODERATE (*)    All other components within normal limits  URINALYSIS, ROUTINE W REFLEX MICROSCOPIC - Abnormal; Notable for the following components:   APPearance HAZY (*)    All other components within normal limits  PREGNANCY, URINE  GC/CHLAMYDIA PROBE AMP ([place]) NOT AT [facility]    EKG  EKG Interpretation None       Radiology No results found.  Procedures Procedures (including critical care time)  Medications Ordered in ED Medications  metroNIDAZOLE (FLAGYL) tablet 500 mg (500 mg Oral Given 04/01/17 0036)       Final Clinical Impressions(s) / ED Diagnoses   Final diagnoses:  BV (bacterial vaginosis)   Return for weakness, numbness, changes in vision or speech,  fevers > 100.4 unrelieved by medication, shortness of breath, intractable vomiting, or diarrhea, abdominal pain, Inability to tolerate liquids or food, cough, altered mental status or any concerns. No signs of systemic illness or infection. The patient is nontoxic-appearing on exam and vital signs are within normal limits.    I have reviewed the triage vital signs and the nursing notes. Pertinent labs &imaging results that were available during my care of the patient were reviewed by me and considered in my medical decision making (see chart for details).  After history, exam, and medical workup I feel the patient has been appropriately medically screened and is safe for discharge home. Pertinent diagnoses were discussed with the patient. Patient was given return precautions. ED Discharge Orders        Ordered    metroNIDAZOLE (FLAGYL) 500 MG tablet  2 times daily     04/01/17 0032       Yancey Pedley, MD 04/01/17 [phone]

## 2017-04-02 LAB — GC/CHLAMYDIA PROBE AMP (~~LOC~~) NOT AT ARMC
Chlamydia: POSITIVE — AB
Neisseria Gonorrhea: NEGATIVE

## 2017-04-03 ENCOUNTER — Emergency Department (HOSPITAL_BASED_OUTPATIENT_CLINIC_OR_DEPARTMENT_OTHER)
Admission: EM | Admit: 2017-04-03 | Discharge: 2017-04-03 | Disposition: A | Discharge status: Home / Self Care | Payer: Self-pay | Attending: Emergency Medicine | Admitting: Emergency Medicine

## 2017-04-03 ENCOUNTER — Encounter (HOSPITAL_BASED_OUTPATIENT_CLINIC_OR_DEPARTMENT_OTHER): Payer: Self-pay | Admitting: *Deleted

## 2017-04-03 ENCOUNTER — Other Ambulatory Visit: Payer: Self-pay

## 2017-04-03 ENCOUNTER — Telehealth: Payer: Self-pay | Admitting: Medical

## 2017-04-03 DIAGNOSIS — R110 Nausea: Secondary | ICD-10-CM | POA: Insufficient documentation

## 2017-04-03 DIAGNOSIS — A749 Chlamydial infection, unspecified: Secondary | ICD-10-CM

## 2017-04-03 DIAGNOSIS — Z79899 Other long term (current) drug therapy: Secondary | ICD-10-CM | POA: Insufficient documentation

## 2017-04-03 MED ORDER — ONDANSETRON 4 MG PO TBDP
4.0000 mg | ORAL_TABLET | Freq: Once | ORAL | Status: AC
  Administered 2017-04-03: 4 mg via ORAL
  Filled 2017-04-03: qty 1

## 2017-04-03 MED ORDER — AZITHROMYCIN 250 MG PO TABS: 1000.0000 mg | ORAL_TABLET | Freq: Once | ORAL | 0 refills | Status: AC

## 2017-04-03 NOTE — Telephone Encounter (Addendum)
Rebecca SimaNatasia Pare tested positive for  Chlamydia. Patient was called by RN and allergies and pharmacy confirmed. Rx sent to pharmacy of choice.   [name], Julie N, PA-C 04/03/2017 8:09 AM      ----- Message from Kathe BectonLori S Berdik, RN sent at 04/02/2017 10:03 AM EST ----- This patient tested positive :  chlamydia  She :"has NKDA", I have informed the patient of her results and confirmed her pharmacy is correct in her chart. Please send Rx.   Thank you,   Kathe BectonBerdik, Lori S, RN   [facility]

## 2017-04-03 NOTE — ED Provider Notes (Signed)
[facility] EMERGENCY DEPARTMENT Provider Note   CSN: [id number] Arrival date & time: 04/03/17  1441     History   Chief Complaint Chief Complaint  Patient presents with  . Nausea    HPI Rebecca Pitts is a 27 y.o. female who presents to ED for evaluation of two episodes of NBNB emesis since this morning, several episodes of diarrhea since last night.  She was seen and evaluated for dysuria, possible STD exposure 2 days ago.  She was called and told she was positive for chlamydia and began taking her azithromycin today.  About 45 minutes after taking the azithromycin, she had another episode of vomiting.  She has had generalized abdominal cramping since then.  As far as she knows, this is the first time she is taking azithromycin.  She reports her son at home had similar GI illness last week.  She cannot recall any inciting event or food ingestion that may have triggered the vomiting.  Reports that she is still having dysuria.  Denies any fevers, hematemesis, hematochezia, melena.  HPI  History reviewed. No pertinent past medical history.  There are no active problems to display for this patient.   History reviewed. No pertinent surgical history.  OB History    No data available       Home Medications    Prior to Admission medications   Medication Sig Start Date End Date Taking? Authorizing Provider  azithromycin (ZITHROMAX) 250 MG tablet Take 4 tablets (1,000 mg total) by mouth once for 1 dose. 04/03/17 04/03/17  [name], Julie N, PA-C  doxycycline (VIBRA-TABS) 100 MG tablet Take 1 tablet (100 mg total) by mouth 2 (two) times daily. 07/28/16   Arvilla MarketWallace, Catherine Lauren, DO  metroNIDAZOLE (FLAGYL) 500 MG tablet Take 1 tablet (500 mg total) by mouth 2 (two) times daily. 01/19/17   Lavera GuiseLiu, Dana Duo, MD  metroNIDAZOLE (FLAGYL) 500 MG tablet Take 1 tablet (500 mg total) by mouth 2 (two) times daily. One po bid x 7 days 04/01/17   Cy BlamerPalumbo, April, MD    Family History No  family history on file.  Social History Social History   Tobacco Use  . Smoking status: Never Smoker  . Smokeless tobacco: Never Used  Substance Use Topics  . Alcohol use: Yes    Comment: 1 x week  . Drug use: Yes    Types: Marijuana     Allergies   Patient has no known allergies.   Review of Systems Review of Systems  Constitutional: Negative for appetite change, chills and fever.  HENT: Negative for ear pain, rhinorrhea, sneezing and sore throat.   Eyes: Negative for photophobia and visual disturbance.  Respiratory: Negative for cough, chest tightness, shortness of breath and wheezing.   Cardiovascular: Negative for chest pain and palpitations.  Gastrointestinal: Positive for abdominal pain, diarrhea, nausea and vomiting. Negative for blood in stool and constipation.  Genitourinary: Positive for dysuria. Negative for hematuria and urgency.  Musculoskeletal: Negative for myalgias.  Skin: Negative for rash.  Neurological: Negative for dizziness, weakness and light-headedness.     Physical Exam Updated Vital Signs BP 119/81 (BP Location: Right Arm)   Pulse 82   Temp 98.7 F (37.1 C) (Oral)   Resp 16   Ht 5\' 6"  (1.676 m)   Wt 86.2 kg (190 lb)   LMP 03/15/2017   SpO2 100%   BMI 30.67 kg/m   Physical Exam  Constitutional: She appears well-developed and well-nourished. No distress.  Nontoxic appearing and in  no acute distress.  Does not appear dehydrated.  HENT:  Head: Normocephalic and atraumatic.  Nose: Nose normal.  Eyes: Conjunctivae and EOM are normal. Right eye exhibits no discharge. Left eye exhibits no discharge. No scleral icterus.  Neck: Normal range of motion. Neck supple.  Cardiovascular: Normal rate, regular rhythm, normal heart sounds and intact distal pulses. Exam reveals no gallop and no friction rub.  No murmur heard. Pulmonary/Chest: Effort normal and breath sounds normal. No respiratory distress.  Abdominal: Soft. Bowel sounds are normal. She  exhibits no distension. There is no tenderness. There is no guarding.  No abdominal tenderness to palpation.  Musculoskeletal: Normal range of motion. She exhibits no edema.  Neurological: She is alert. She exhibits normal muscle tone. Coordination normal.  Skin: Skin is warm and dry. No rash noted.  Psychiatric: She has a normal mood and affect.  Nursing note and vitals reviewed.    ED Treatments / Results  Labs (all labs ordered are listed, but only abnormal results are displayed) Labs Reviewed - No data to display  EKG  EKG Interpretation None       Radiology No results found.  Procedures Procedures (including critical care time)  Medications Ordered in ED Medications  ondansetron (ZOFRAN-ODT) disintegrating tablet 4 mg (4 mg Oral Given 04/03/17 1934)     Initial Impression / Assessment and Plan / ED Course  I have reviewed the triage vital signs and the nursing notes.  Pertinent labs & imaging results that were available during my care of the patient were reviewed by me and considered in my medical decision making (see chart for details).     Patient presents to ED for evaluation of 2 episodes of NBNB emesis since this morning.  She has been having diarrhea for the past several days, similarly to her son symptoms last week.  She took her first dose of azithromycin for positive chlamydia test this morning.  She vomited afterwards.  On physical exam she is overall well-appearing.  She has no abdominal tenderness to palpation.  She is afebrile.  She does not appear dehydrated.  Patient given 1 dose of Zofran here with complete resolution of her symptoms.  She states that she is ready to go home as her nausea has resolved.  Patient asking for a work note. I  Suspect that her symptoms could be a side effect of the antibiotics, as she is on Flagyl as well. Low suspicion for acute intra-abdominal/surgical process being the cause of her symptoms. Patient appears stable for  discharge at this time. Strict return precautions given.  Portions of this note were generated with Scientist, clinical (histocompatibility and immunogenetics). Dictation errors may occur despite best attempts at proofreading.   Final Clinical Impressions(s) / ED Diagnoses   Final diagnoses:  Nausea    ED Discharge Orders    None       Dietrich Pates, PA-C 04/03/17 2022    Derwood Kaplan, MD 04/04/17 [phone]

## 2017-04-03 NOTE — ED Notes (Signed)
Pt. Said she feels better and doesn't feel as nauseated now.

## 2017-04-03 NOTE — ED Triage Notes (Signed)
Pt reports one episode of vomiting this morning. Reports she then ate breakfast and took medicine for STD that was called into pharmacy for her. SHe states she felt dizzy then reports she vomited an hour after taking those medicines and she still feel nauseated now. States was at work when this happened and she was seen by EMS and advised to come to ED

## 2017-04-03 NOTE — Discharge Instructions (Signed)
Continue your home medications as previously prescribed. Return to ED for worsening symptoms, severe abdominal pain, increased vomiting, lightheadedness or loss of consciousness.

## 2017-06-27 ENCOUNTER — Encounter (HOSPITAL_BASED_OUTPATIENT_CLINIC_OR_DEPARTMENT_OTHER): Payer: Self-pay | Admitting: Adult Health

## 2017-06-27 ENCOUNTER — Emergency Department (HOSPITAL_BASED_OUTPATIENT_CLINIC_OR_DEPARTMENT_OTHER)
Admission: EM | Admit: 2017-06-27 | Discharge: 2017-06-27 | Disposition: A | Discharge status: Home / Self Care | Payer: Self-pay | Attending: Emergency Medicine | Admitting: Emergency Medicine

## 2017-06-27 ENCOUNTER — Other Ambulatory Visit: Payer: Self-pay

## 2017-06-27 DIAGNOSIS — R300 Dysuria: Secondary | ICD-10-CM | POA: Insufficient documentation

## 2017-06-27 LAB — WET PREP, GENITAL
Sperm: NONE SEEN
Trich, Wet Prep: NONE SEEN
Yeast Wet Prep HPF POC: NONE SEEN

## 2017-06-27 LAB — URINALYSIS, ROUTINE W REFLEX MICROSCOPIC
Bilirubin Urine: NEGATIVE
GLUCOSE, UA: NEGATIVE mg/dL
Hgb urine dipstick: NEGATIVE
KETONES UR: NEGATIVE mg/dL
Leukocytes, UA: NEGATIVE
Nitrite: NEGATIVE
PROTEIN: NEGATIVE mg/dL
Specific Gravity, Urine: 1.015 (ref 1.005–1.030)
pH: 8.5 — ABNORMAL HIGH (ref 5.0–8.0)

## 2017-06-27 LAB — PREGNANCY, URINE: Preg Test, Ur: NEGATIVE

## 2017-06-27 MED ORDER — PHENAZOPYRIDINE HCL 200 MG PO TABS: 200.0000 mg | ORAL_TABLET | Freq: Three times a day (TID) | ORAL | 0 refills | Status: DC

## 2017-06-27 NOTE — ED Triage Notes (Signed)
Pt states she has some dysuria and blood on the tissue paper after wiping after urinating, not having a bowel movement. THis began Thursday. Denies fevers.

## 2017-06-27 NOTE — ED Provider Notes (Signed)
[facility] EMERGENCY DEPARTMENT Provider Note   CSN: [id number] Arrival date & time: 06/27/17  1041     History   Chief Complaint Chief Complaint  Patient presents with  . Hematuria    HPI Rebecca Pitts is a 27 y.o. female.  Patient is a healthy 27 year old female presenting today with 3 days of dysuria.  She states when she wipes there is some blood on the toilet paper but when she is wearing a pad there is no bleeding.  Last menses was at the end of April.  She has had some thin vaginal discharge but no itching.  She only gets burning with urination.  No history of frequent UTIs.  Prior history of bacterial vaginosis.  Patient's partner is asymptomatic and has been with the same partner for a year.  The history is provided by the patient.  Dysuria   This is a new problem. Episode onset: 3 days. The problem occurs every urination. The problem has been gradually worsening. The quality of the pain is described as burning. The pain is at a severity of 4/10. The pain is moderate. There has been no fever. She is sexually active (1 partner for the last year and does not use protection). Associated symptoms include discharge, frequency and hematuria. Pertinent negatives include no chills, no nausea, no vomiting, no hesitancy, no possible pregnancy and no flank pain. She has tried nothing for the symptoms. Her past medical history does not include recurrent UTIs.    History reviewed. No pertinent past medical history.  There are no active problems to display for this patient.   History reviewed. No pertinent surgical history.   OB History   None      Home Medications    Prior to Admission medications   Medication Sig Start Date End Date Taking? Authorizing Provider  doxycycline (VIBRA-TABS) 100 MG tablet Take 1 tablet (100 mg total) by mouth 2 (two) times daily. 07/28/16   Arvilla Market, DO  metroNIDAZOLE (FLAGYL) 500 MG tablet Take 1 tablet (500 mg  total) by mouth 2 (two) times daily. 01/19/17   Lavera Guise, MD  metroNIDAZOLE (FLAGYL) 500 MG tablet Take 1 tablet (500 mg total) by mouth 2 (two) times daily. One po bid x 7 days 04/01/17   Nicanor Alcon, April, MD    Family History History reviewed. No pertinent family history.  Social History Social History   Tobacco Use  . Smoking status: Never Smoker  . Smokeless tobacco: Never Used  Substance Use Topics  . Alcohol use: Yes    Comment: 1 x week  . Drug use: Yes    Types: Marijuana     Allergies   Patient has no known allergies.   Review of Systems Review of Systems  Constitutional: Negative for chills.  Gastrointestinal: Negative for nausea and vomiting.  Genitourinary: Positive for dysuria, frequency and hematuria. Negative for flank pain and hesitancy.  All other systems reviewed and are negative.    Physical Exam Updated Vital Signs BP 109/77   Pulse 95   Temp 98.5 F (36.9 C) (Oral)   Resp 18   Ht  (1.676 m)   Wt 86.2 kg (190 lb)   LMP 06/13/2017 (Approximate)   SpO2 99%   BMI 30.67 kg/m   Physical Exam  Constitutional: She is oriented to person, place, and time. She appears well-developed and well-nourished. No distress.  HENT:  Head: Normocephalic and atraumatic.  Eyes: Pupils are equal, round, and reactive to light.  EOM are normal.  Cardiovascular: Normal rate, regular rhythm, normal heart sounds and intact distal pulses. Exam reveals no friction rub.  No murmur heard. Pulmonary/Chest: Effort normal and breath sounds normal. She has no wheezes. She has no rales.  Abdominal: Soft. Bowel sounds are normal. She exhibits no distension. There is tenderness in the suprapubic area. There is no rebound and no guarding.  Musculoskeletal: Normal range of motion. She exhibits no tenderness.  No edema  Neurological: She is alert and oriented to person, place, and time. No cranial nerve deficit.  Skin: Skin is warm and dry. No rash noted.  Psychiatric: She  has a normal mood and affect. Her behavior is normal.  Nursing note and vitals reviewed.    ED Treatments / Results  Labs (all labs ordered are listed, but only abnormal results are displayed) Labs Reviewed  URINALYSIS, ROUTINE W REFLEX MICROSCOPIC - Abnormal; Notable for the following components:      Result Value   pH 8.5 (*)    All other components within normal limits  WET PREP, GENITAL  PREGNANCY, URINE  GC/CHLAMYDIA PROBE AMP ([place]) NOT AT [facility]    EKG None  Radiology No results found.  Procedures Procedures (including critical care time)  Medications Ordered in ED Medications - No data to display   Initial Impression / Assessment and Plan / ED Course  I have reviewed the triage vital signs and the nursing notes.  Pertinent labs & imaging results that were available during my care of the patient were reviewed by me and considered in my medical decision making (see chart for details).     Patient presenting with dysuria for 3 days and some hematuria.  No complaint of vaginal symptoms except for some clear discharge which is not necessarily atypical.  No significant high risk sexual practices.  She has had the same partner for a year but does not use protection.  Patient's urine pregnancy test is negative.  UA within normal limits without evidence of blood or white cells.  We will do a pelvic exam to further evaluate if there is any pathology causing her symptoms.  We will culture the urine.  Patient is not displaying any symptoms concerning for kidney stone or pyelonephritis.  12:27 PM Patient's pelvic exam is completely normal without evidence of bleeding or discharge.  She has no cervical motion tenderness or other concerns.  On further history taking patient states that this is happened intermittently and she did see her PCP about it but her urine tests were normal.  Concern for possible interstitial cystitis.  Recommended that patient follow-up with her  OB/GYN.  Final Clinical Impressions(s) / ED Diagnoses   Final diagnoses:  Dysuria    ED Discharge Orders        Ordered    phenazopyridine (PYRIDIUM) 200 MG tablet  3 times daily     06/27/17 1228       Gwyneth Sprout, MD 06/27/17 1229

## 2017-06-29 LAB — GC/CHLAMYDIA PROBE AMP (~~LOC~~) NOT AT ARMC
CHLAMYDIA, DNA PROBE: NEGATIVE
NEISSERIA GONORRHEA: NEGATIVE

## 2017-06-29 NOTE — ED Notes (Signed)
06/29/2017, attempted to contact pt. With a return call.  No answer, message left.

## 2017-06-30 NOTE — ED Notes (Signed)
Pt. Called and left a message for results to be put on her voicemail.  Called pt. And message left for a return call.  07/01/2107

## 2018-06-25 ENCOUNTER — Other Ambulatory Visit: Payer: Self-pay

## 2018-06-25 ENCOUNTER — Encounter (HOSPITAL_BASED_OUTPATIENT_CLINIC_OR_DEPARTMENT_OTHER): Payer: Self-pay

## 2018-06-25 ENCOUNTER — Emergency Department (HOSPITAL_BASED_OUTPATIENT_CLINIC_OR_DEPARTMENT_OTHER)
Admission: EM | Admit: 2018-06-25 | Discharge: 2018-06-25 | Disposition: A | Discharge status: Home / Self Care | Payer: Self-pay | Attending: Emergency Medicine | Admitting: Emergency Medicine

## 2018-06-25 DIAGNOSIS — N3001 Acute cystitis with hematuria: Secondary | ICD-10-CM | POA: Insufficient documentation

## 2018-06-25 LAB — WET PREP, GENITAL
Clue Cells Wet Prep HPF POC: NONE SEEN
Sperm: NONE SEEN
Trich, Wet Prep: NONE SEEN
Yeast Wet Prep HPF POC: NONE SEEN

## 2018-06-25 LAB — URINALYSIS, MICROSCOPIC (REFLEX): RBC / HPF: >50 RBC/hpf (ref 0–5)

## 2018-06-25 LAB — URINALYSIS, ROUTINE W REFLEX MICROSCOPIC
Bilirubin Urine: NEGATIVE
Glucose, UA: NEGATIVE mg/dL
Ketones, ur: NEGATIVE mg/dL
Leukocytes,Ua: NEGATIVE
Nitrite: NEGATIVE
Protein, ur: NEGATIVE mg/dL
Specific Gravity, Urine: >1.03 — ABNORMAL HIGH (ref 1.005–1.030)
pH: 6 (ref 5.0–8.0)

## 2018-06-25 LAB — PREGNANCY, URINE: Preg Test, Ur: NEGATIVE

## 2018-06-25 MED ORDER — CEPHALEXIN 500 MG PO CAPS: 500.0000 mg | ORAL_CAPSULE | Freq: Two times a day (BID) | ORAL | 0 refills | Status: DC

## 2018-06-25 NOTE — Discharge Instructions (Signed)
Take Keflex until completed for urinary tract infection.  Urine culture will be sent and you will be called if there needs to be a change in your antibiotics. You have been treated for gonorrhea and chlamydia today. You will be called in 3 days if any of your tests return positive. In that case, please make all of your sexual partners aware that they will need to be treated as well. Abstain from intercourse for one week until you have both been treated. Use condoms in the future to help prevent sexually transmitted disease and unwanted pregnancy.  Please return to the emergency department if you develop any new or worsening symptoms including severe back pain, severe abdominal pain, passing out, persistent fever 100.4, intractable vomiting, or any other new or concerning symptoms.

## 2018-06-25 NOTE — ED Notes (Signed)
Burning w urination x 6 days,  Denies dc.

## 2018-06-25 NOTE — ED Triage Notes (Signed)
C/o dysuria x 6 days-NAD-steady gait

## 2018-06-25 NOTE — ED Provider Notes (Signed)
[facility] EMERGENCY DEPARTMENT Provider Note   CSN: [id number] Arrival date & time: 06/25/18  1523    History   Chief Complaint Chief Complaint  Patient presents with  . Dysuria    HPI Rebecca Pitts is a 28 y.o. female who is previously healthy who presents with a 6-day history of dysuria and suprapubic pain.  Patient denies any vaginal discharge or bleeding, however she has a concern for STD exposure recently.  She reports having oral intercourse as well and had a sore throat for couple days, however this is resolved.  She denies any other abdominal pain, nausea, vomiting, fevers, chest pain, shortness of breath.  She has not taken any medications over-the-counter for her symptoms.     HPI  History reviewed. No pertinent past medical history.  There are no active problems to display for this patient.   History reviewed. No pertinent surgical history.   OB History   No obstetric history on file.      Home Medications    Prior to Admission medications   Medication Sig Start Date End Date Taking? Authorizing Provider  cephALEXin (KEFLEX) 500 MG capsule Take 1 capsule (500 mg total) by mouth 2 (two) times daily. 06/25/18   Christien Frankl, Waylan Boga, PA-C  doxycycline (VIBRA-TABS) 100 MG tablet Take 1 tablet (100 mg total) by mouth 2 (two) times daily. 07/28/16   Arvilla Market, DO  metroNIDAZOLE (FLAGYL) 500 MG tablet Take 1 tablet (500 mg total) by mouth 2 (two) times daily. 01/19/17   Lavera Guise, MD  metroNIDAZOLE (FLAGYL) 500 MG tablet Take 1 tablet (500 mg total) by mouth 2 (two) times daily. One po bid x 7 days 04/01/17   Palumbo, April, MD  phenazopyridine (PYRIDIUM) 200 MG tablet Take 1 tablet (200 mg total) by mouth 3 (three) times daily. 06/27/17   Gwyneth Sprout, MD    Family History No family history on file.  Social History Social History   Tobacco Use  . Smoking status: Never Smoker  . Smokeless tobacco: Never Used  Substance Use  Topics  . Alcohol use: Yes    Comment: occ  . Drug use: Yes    Types: Marijuana     Allergies   Patient has no known allergies.   Review of Systems Review of Systems  Constitutional: Negative for chills and fever.  HENT: Negative for facial swelling and sore throat.   Respiratory: Negative for shortness of breath.   Cardiovascular: Negative for chest pain.  Gastrointestinal: Positive for abdominal pain (suprapubic). Negative for diarrhea, nausea and vomiting.  Genitourinary: Negative for dysuria, flank pain, frequency, urgency, vaginal bleeding and vaginal discharge.  Musculoskeletal: Negative for back pain.  Skin: Negative for rash and wound.  Neurological: Negative for headaches.  Psychiatric/Behavioral: The patient is not nervous/anxious.      Physical Exam Updated Vital Signs BP 121/74 (BP Location: Left Arm)   Pulse 95   Temp 98.5 F (36.9 C) (Oral)   Resp 18   Ht 5\' 5"  (1.651 m)   Wt 87.1 kg   LMP 06/13/2018   SpO2 99%   BMI 31.95 kg/m   Physical Exam Vitals signs and nursing note reviewed. Exam conducted with a chaperone present.  Constitutional:      General: She is not in acute distress.    Appearance: She is well-developed. She is not diaphoretic.  HENT:     Head: Normocephalic and atraumatic.     Mouth/Throat:     Pharynx: No oropharyngeal  exudate.  Eyes:     General: No scleral icterus.       Right eye: No discharge.        Left eye: No discharge.     Conjunctiva/sclera: Conjunctivae normal.     Pupils: Pupils are equal, round, and reactive to light.  Neck:     Musculoskeletal: Normal range of motion and neck supple.     Thyroid: No thyromegaly.  Cardiovascular:     Rate and Rhythm: Normal rate and regular rhythm.     Heart sounds: Normal heart sounds. No murmur. No friction rub. No gallop.   Pulmonary:     Effort: Pulmonary effort is normal. No respiratory distress.     Breath sounds: Normal breath sounds. No stridor. No wheezing or rales.   Abdominal:     General: Bowel sounds are normal. There is no distension.     Palpations: Abdomen is soft.     Tenderness: There is no abdominal tenderness. There is no right CVA tenderness, left CVA tenderness, guarding or rebound.  Genitourinary:    Vagina: Vaginal discharge (scant) present.     Cervix: Discharge (white/clear could be physiologic) present. No cervical motion tenderness.     Adnexa:        Right: No tenderness.         Left: No tenderness.    Lymphadenopathy:     Cervical: No cervical adenopathy.  Skin:    General: Skin is warm and dry.     Coloration: Skin is not pale.     Findings: No rash.  Neurological:     Mental Status: She is alert.     Coordination: Coordination normal.      ED Treatments / Results  Labs (all labs ordered are listed, but only abnormal results are displayed) Labs Reviewed  WET PREP, GENITAL - Abnormal; Notable for the following components:      Result Value   WBC, Wet Prep HPF POC MANY (*)    All other components within normal limits  URINALYSIS, ROUTINE W REFLEX MICROSCOPIC - Abnormal; Notable for the following components:   Color, Urine STRAW (*)    APPearance CLOUDY (*)    Specific Gravity, Urine >1.030 (*)    Hgb urine dipstick LARGE (*)    All other components within normal limits  URINALYSIS, MICROSCOPIC (REFLEX) - Abnormal; Notable for the following components:   Bacteria, UA MANY (*)    All other components within normal limits  URINE CULTURE  PREGNANCY, URINE  RPR  HIV ANTIBODY (ROUTINE TESTING W REFLEX)  GC/CHLAMYDIA PROBE AMP ([place]) NOT AT [facility]    EKG None  Radiology No results found.  Procedures Procedures (including critical care time)  Medications Ordered in ED Medications - No data to display   Initial Impression / Assessment and Plan / ED Course  I have reviewed the triage vital signs and the nursing notes.  Pertinent labs & imaging results that were available during my care of the patient  were reviewed by me and considered in my medical decision making (see chart for details).        Patient presenting with a 6-day history of dysuria.  She is found to have large hematuria, many bacteria.  Urine culture sent.  Wet prep and is negative except for many WBCs.  GC/chlamydia, HIV, RPR pending.  Urine pregnancy negative.  Will treat with Keflex.  Patient advised she would be called if any other positive results.  Patient advised to return if  she develops any sore throat or changes in her tonsils.  Considering no symptoms at this time, patient opted for only genital swabs for STD.  Return precautions discussed.  Patient understands and agrees with plan.  Patient vital stable throughout ED course and discharged in satisfactory condition.  Final Clinical Impressions(s) / ED Diagnoses   Final diagnoses:  Acute cystitis with hematuria    ED Discharge Orders         Ordered    cephALEXin (KEFLEX) 500 MG capsule  2 times daily     06/25/18 1743           Emi HolesLaw, Gilman Olazabal M, PA-C 06/25/18 1756    Arby BarrettePfeiffer, Marcy, MD 06/28/18 1539

## 2018-06-26 ENCOUNTER — Telehealth (HOSPITAL_BASED_OUTPATIENT_CLINIC_OR_DEPARTMENT_OTHER): Payer: Self-pay | Admitting: Emergency Medicine

## 2018-06-26 LAB — RPR: RPR Ser Ql: NONREACTIVE

## 2018-06-26 LAB — HIV ANTIBODY (ROUTINE TESTING W REFLEX): HIV Screen 4th Generation wRfx: NONREACTIVE

## 2018-06-26 MED ORDER — CEPHALEXIN 500 MG PO CAPS: ORAL_CAPSULE | ORAL | 0 refills | Status: DC

## 2018-06-26 NOTE — Telephone Encounter (Signed)
Patient called stating that she would like her antibiotic prescribed to a different pharmacy.  This order will be placed.

## 2018-06-27 LAB — URINE CULTURE: Culture: >=100000 — AB

## 2018-06-28 ENCOUNTER — Telehealth: Payer: Self-pay | Admitting: *Deleted

## 2018-06-28 LAB — GC/CHLAMYDIA PROBE AMP (~~LOC~~) NOT AT ARMC
Chlamydia: NEGATIVE
Neisseria Gonorrhea: NEGATIVE

## 2018-06-28 NOTE — Telephone Encounter (Signed)
Post ED Visit - Positive Culture Follow-up  Culture report reviewed by antimicrobial stewardship pharmacist: [facility] []  Enzo Bi, Pharm.D. []  Celedonio Miyamoto, Pharm.D., BCPS AQ-ID []  Garvin Fila, Pharm.D., BCPS []  Georgina Pillion, Pharm.D., BCPS []  [place], [place].D., BCPS, AAHIVP []  Estella Husk, Pharm.D., BCPS, AAHIVP []  Lysle Pearl, PharmD, BCPS []  Phillips Climes, PharmD, BCPS []  Agapito Games, PharmD, BCPS []  Verlan Friends, PharmD []  Mervyn Gay, PharmD, BCPS []  Vinnie Level, PharmD Lenord Carbo, PharmD  [facility] []  Len Childs, PharmD []  Greer Pickerel, PharmD []  Adalberto Cole, PharmD []  Perlie Gold, Rph []  Lonell Face) Jean Rosenthal, PharmD []  Earl Many, PharmD []  Junita Push, PharmD []  Dorna Leitz, PharmD []  Terrilee Files, PharmD []  Lynann Beaver, PharmD []  Keturah Barre, PharmD []  Loralee Pacas, PharmD []  Bernadene Person, PharmD   Positive urine culture Treated with Cephalexin, organism sensitive to the same and no further patient follow-up is required at this time.  Virl [facility] 06/28/2018, 8:33 AM

## 2018-09-16 ENCOUNTER — Encounter (HOSPITAL_BASED_OUTPATIENT_CLINIC_OR_DEPARTMENT_OTHER): Payer: Self-pay

## 2018-09-16 ENCOUNTER — Other Ambulatory Visit: Payer: Self-pay

## 2018-09-16 ENCOUNTER — Emergency Department (HOSPITAL_BASED_OUTPATIENT_CLINIC_OR_DEPARTMENT_OTHER)
Admission: EM | Admit: 2018-09-16 | Discharge: 2018-09-16 | Disposition: A | Discharge status: Home / Self Care | Payer: Self-pay | Attending: Emergency Medicine | Admitting: Emergency Medicine

## 2018-09-16 DIAGNOSIS — N760 Acute vaginitis: Secondary | ICD-10-CM

## 2018-09-16 DIAGNOSIS — Z711 Person with feared health complaint in whom no diagnosis is made: Secondary | ICD-10-CM

## 2018-09-16 DIAGNOSIS — Z79899 Other long term (current) drug therapy: Secondary | ICD-10-CM | POA: Insufficient documentation

## 2018-09-16 DIAGNOSIS — B9689 Other specified bacterial agents as the cause of diseases classified elsewhere: Secondary | ICD-10-CM

## 2018-09-16 DIAGNOSIS — Z113 Encounter for screening for infections with a predominantly sexual mode of transmission: Secondary | ICD-10-CM | POA: Insufficient documentation

## 2018-09-16 LAB — URINALYSIS, ROUTINE W REFLEX MICROSCOPIC
Bilirubin Urine: NEGATIVE
Glucose, UA: NEGATIVE mg/dL
Hgb urine dipstick: NEGATIVE
Ketones, ur: NEGATIVE mg/dL
Leukocytes,Ua: NEGATIVE
Nitrite: NEGATIVE
Protein, ur: NEGATIVE mg/dL
Specific Gravity, Urine: >1.03 — ABNORMAL HIGH (ref 1.005–1.030)
pH: 6 (ref 5.0–8.0)

## 2018-09-16 LAB — WET PREP, GENITAL
Sperm: NONE SEEN
Trich, Wet Prep: NONE SEEN
Yeast Wet Prep HPF POC: NONE SEEN

## 2018-09-16 LAB — PREGNANCY, URINE: Preg Test, Ur: NEGATIVE

## 2018-09-16 MED ORDER — AZITHROMYCIN 250 MG PO TABS
1000.0000 mg | ORAL_TABLET | Freq: Once | ORAL | Status: AC
  Administered 2018-09-16: 1000 mg via ORAL
  Filled 2018-09-16: qty 4

## 2018-09-16 MED ORDER — CEFTRIAXONE SODIUM 250 MG IJ SOLR
250.0000 mg | Freq: Once | INTRAMUSCULAR | Status: AC
  Administered 2018-09-16: 250 mg via INTRAMUSCULAR
  Filled 2018-09-16: qty 250

## 2018-09-16 MED ORDER — METRONIDAZOLE 500 MG PO TABS: 500.0000 mg | ORAL_TABLET | Freq: Two times a day (BID) | ORAL | 0 refills | Status: DC

## 2018-09-16 NOTE — ED Provider Notes (Signed)
[facility] Provider Note   CSN: [id number] Arrival date & time: 09/16/18  1859    History   Chief Complaint Chief Complaint  Patient presents with  . Dysuria  . Vaginal Discharge    HPI Rebecca Pitts is a 28 y.o. female who presents for evaluation of 2 days of dysuria.  She also notes that she has had increased vaginal discharge.  She states that it is a white discharge that is malodorous.  She has not noted any hematuria.  She does report that over the last month or so, she has had some irregular vaginal spotting.  She has taken over-the-counter pregnancy tests which are negative.  She does report that she has had some intermittent slight abdominal cramping.  Denies any pain.  She states she is currently sexually active with one partner.  They do not use protection.  Patient denies any fevers, nausea/vomiting.      The history is provided by the patient.    History reviewed. No pertinent past medical history.  There are no active problems to display for this patient.   History reviewed. No pertinent surgical history.   OB History   No obstetric history on file.      Home Medications    Prior to Admission medications   Medication Sig Start Date End Date Taking? Authorizing Provider  metroNIDAZOLE (FLAGYL) 500 MG tablet Take 1 tablet (500 mg total) by mouth 2 (two) times daily. 09/16/18   Volanda Napoleon, PA-C  phentermine (ADIPEX-P) 37.5 MG tablet Take 37.5 mg by mouth daily. 08/07/18   [provider]    Family History No family history on file.  Social History Social History   Tobacco Use  . Smoking status: Never Smoker  . Smokeless tobacco: Never Used  Substance Use Topics  . Alcohol use: Yes    Comment: occ  . Drug use: Yes    Types: Marijuana     Allergies   Patient has no known allergies.   Review of Systems Review of Systems  Constitutional: Negative for fever.  Gastrointestinal: Negative for  abdominal pain, nausea and vomiting.  Genitourinary: Positive for dysuria and vaginal discharge. Negative for hematuria.  All other systems reviewed and are negative.    Physical Exam Updated Vital Signs BP 99/73 (BP Location: Right Arm)   Pulse 82   Temp 98.2 F (36.8 C) (Oral)   Resp 20   Ht 5\' 6"  (1.676 m)   Wt 82.6 kg   LMP 09/01/2018   SpO2 100%   BMI 29.38 kg/m   Physical Exam Vitals signs and nursing note reviewed. Exam conducted with a chaperone present.  Constitutional:      Appearance: She is well-developed.  HENT:     Head: Normocephalic and atraumatic.  Eyes:     General: No scleral icterus.       Right eye: No discharge.        Left eye: No discharge.     Conjunctiva/sclera: Conjunctivae normal.  Pulmonary:     Effort: Pulmonary effort is normal.  Abdominal:     Comments: Abdomen is soft, non-distended, non-tender. No rigidity, No guarding. No peritoneal signs.  Genitourinary:    Vagina: Vaginal discharge present. No bleeding.     Cervix: Discharge present. No cervical motion tenderness.     Adnexa: Right adnexa normal and left adnexa normal.       Right: No mass or tenderness.         Left:  No mass or tenderness.       Comments: The exam was performed with a chaperone present.  Thick white discharge in vaginal vault and discharge noted from cervix.  No cervical friability.  No CMT.  No adnexal mass or tenderness bilaterally. Skin:    General: Skin is warm and dry.  Neurological:     Mental Status: She is alert.  Psychiatric:        Speech: Speech normal.        Behavior: Behavior normal.      ED Treatments / Results  Labs (all labs ordered are listed, but only abnormal results are displayed) Labs Reviewed  WET PREP, GENITAL - Abnormal; Notable for the following components:      Result Value   Clue Cells Wet Prep HPF POC PRESENT (*)    WBC, Wet Prep HPF POC MANY (*)    All other components within normal limits  URINALYSIS, ROUTINE W REFLEX  MICROSCOPIC - Abnormal; Notable for the following components:   Specific Gravity, Urine >1.030 (*)    All other components within normal limits  PREGNANCY, URINE  HIV ANTIBODY (ROUTINE TESTING W REFLEX)  RPR  GC/CHLAMYDIA PROBE AMP ([place]    EKG None  Radiology No results found.  Procedures Procedures (including critical care time)  Medications Ordered in ED Medications  cefTRIAXone (ROCEPHIN) injection 250 mg (250 mg Intramuscular Given 09/16/18 1959)  azithromycin (ZITHROMAX) tablet 1,000 mg (1,000 mg Oral Given 09/16/18 1959)     Initial Impression / Assessment and Plan / ED Course  I have reviewed the triage vital signs and the nursing notes.  Pertinent labs & imaging results that were available during my care of the patient were reviewed by me and considered in my medical decision making (see chart for details).        28 year old female who presents for evaluation of dysuria and vaginal discharge.  States that she is also had some intermittent vaginal spotting but no current vaginal bleeding.  She denies any fevers.  She has had some intermittent abdominal cramping but denies any abdominal pain, nausea/vomiting.  Is currently sexually active with one partner. Patient is afebrile, non-toxic appearing, sitting comfortably on examination table. Vital signs reviewed and stable.  On exam, abdominal exam is benign.  Will plan for urine, pelvic exam.  Urine pregnancy negative.  UA negative for any infectious etiology.  Pelvic exam as documented above.  Discharge noted in vaginal vault as well as cervix.  No adnexal tenderness or CMT.  Exam not concerning for PID.  I discussed with patient.  She does want to be tested for gonorrhea/committee and HIV and syphilis.  Wet prep does show clue cells.  Discussed results with patient.  Discussed treatment options with patient.  She would rather be empirically treated for gonorrhea/chlamydia.  Patient with no known drug  allergies.  Encouraged safe sex practices.  Distally, instructed OB/GYN follow-up regarding her intermittent vaginal bleeding. At this time, patient exhibits no emergent life-threatening condition that require further evaluation in ED or admission. Patient had ample opportunity for questions and discussion. All patient's questions were answered with full understanding. Strict return precautions discussed. Patient expresses understanding and agreement to plan.   Portions of this note were generated with Scientist, clinical (histocompatibility and immunogenetics)Dragon dictation software. Dictation errors may occur despite best attempts at proofreading.   Final Clinical Impressions(s) / ED Diagnoses   Final diagnoses:  BV (bacterial vaginosis)  Concern about STD in female without diagnosis  ED Discharge Orders         Ordered    metroNIDAZOLE (FLAGYL) 500 MG tablet  2 times daily     09/16/18 2029           Rosana HoesLayden, Lindsey A, PA-C 09/16/18 2105    Charlynne PanderYao, David Hsienta, MD 09/20/18 [phone]

## 2018-09-16 NOTE — Discharge Instructions (Signed)
Your wet prep today was concerning for BV.  Please take antibiotics as directed.  Take Flagyl as directed.  It is very important that you do not consume any alcohol while taking this medication as it will cause you to become violently ill.  You have been treated today for an STD.   The test results with take 2-3 days to return. If there is an abnormal result, you will be notified. If you do not hear anything, that means the results were negative. You can also log on MyChart to see the results.   Your sexual partner needs to be treated too. Do not have sexual intercourse for the next 7 days and after your partner has been treated.   Follow-up with your primary care doctor in 2-4 days. If you do not have a primary care doctor, you can use one listed in the paperwork.   Return to the Emergency Department for any fever, abdominal pain, difficulty breathing, nausea/vomiting or any other worsening or concerning symptoms.

## 2018-09-16 NOTE — ED Triage Notes (Signed)
Pt c/o dysuria and vaginal d/c x 2 days-NAD-steady gait

## 2018-09-18 LAB — GC/CHLAMYDIA PROBE AMP (~~LOC~~) NOT AT ARMC
Chlamydia: NEGATIVE
Neisseria Gonorrhea: NEGATIVE

## 2018-09-18 LAB — RPR: RPR Ser Ql: NONREACTIVE

## 2018-09-18 LAB — HIV ANTIBODY (ROUTINE TESTING W REFLEX): HIV Screen 4th Generation wRfx: NONREACTIVE

## 2018-11-18 ENCOUNTER — Emergency Department (HOSPITAL_BASED_OUTPATIENT_CLINIC_OR_DEPARTMENT_OTHER)
Admission: EM | Admit: 2018-11-18 | Discharge: 2018-11-18 | Disposition: A | Discharge status: Home / Self Care | Payer: Self-pay | Attending: Emergency Medicine | Admitting: Emergency Medicine

## 2018-11-18 ENCOUNTER — Other Ambulatory Visit: Payer: Self-pay

## 2018-11-18 ENCOUNTER — Encounter (HOSPITAL_BASED_OUTPATIENT_CLINIC_OR_DEPARTMENT_OTHER): Payer: Self-pay | Admitting: Emergency Medicine

## 2018-11-18 DIAGNOSIS — B354 Tinea corporis: Secondary | ICD-10-CM | POA: Insufficient documentation

## 2018-11-18 MED ORDER — FLUCONAZOLE 150 MG PO TABS: 150.0000 mg | ORAL_TABLET | ORAL | 0 refills | Status: AC

## 2018-11-18 MED ORDER — KETOCONAZOLE 2 % EX CREA: 1.0000 "application " | TOPICAL_CREAM | Freq: Two times a day (BID) | CUTANEOUS | 0 refills | Status: AC

## 2018-11-18 NOTE — ED Provider Notes (Signed)
Emergency Department Provider Note   I have reviewed the triage vital signs and the nursing notes.   HISTORY  Chief Complaint Rash   HPI Rebecca Pitts is a 28 y.o. female returns to the emergency department with new rash on her left upper leg.  She was diagnosed with ringworm several weeks ago.  She was applying an over-the-counter cream for 2 weeks but states the rash did not improve significantly.  She notes that it is now started to decrease but a new area has developed right next to the old, larger rash.  She denies any redness.  No fevers or chills.  No radiation of symptoms or other modifying factors.  She states her son also has a set of lesions and a hair and is being treated with both topical and oral medications.  History reviewed. No pertinent past medical history.  There are no active problems to display for this patient.   History reviewed. No pertinent surgical history.  Allergies Patient has no known allergies.  No family history on file.  Social History Social History   Tobacco Use  . Smoking status: Never Smoker  . Smokeless tobacco: Never Used  Substance Use Topics  . Alcohol use: Yes    Comment: occ  . Drug use: Yes    Types: Marijuana    Review of Systems  Constitutional: No fever/chills Skin: Positive left thigh rash.   10-point ROS otherwise negative.  ____________________________________________   PHYSICAL EXAM:  VITAL SIGNS: ED Triage Vitals  Enc Vitals Group     BP 11/18/18 0916 114/73     Pulse Rate 11/18/18 0916 90     Resp 11/18/18 0916 16     Temp 11/18/18 0916 98.5 F (36.9 C)     Temp Source 11/18/18 0916 Oral     SpO2 11/18/18 0916 100 %     Weight 11/18/18 0917 200 lb 3.2 oz (90.8 kg)     Height 11/18/18 0917 5\' 6"  (1.676 m)   Constitutional: Alert and oriented. Well appearing and in no acute distress. Eyes: Conjunctivae are normal.  Head: Atraumatic. Nose: No congestion/rhinnorhea. Mouth/Throat: Mucous  membranes are moist.  Neck: No stridor.  Cardiovascular: Normal rate, regular rhythm. Respiratory: Normal respiratory effort.   Gastrointestinal: No distention.  Musculoskeletal: No gross deformities of extremities. Neurologic:  Normal speech and language.  Skin: 2 areas of circular rash on the left anterior, distal thigh.  There is a central clearing with surrounding raised area consistent with tinea. No surrounding cellulitis.   ____________________________________________   PROCEDURES  Procedure(s) performed:   Procedures  None  ____________________________________________   INITIAL IMPRESSION / ASSESSMENT AND PLAN / ED COURSE  Pertinent labs & imaging results that were available during my care of the patient were reviewed by me and considered in my medical decision making (see chart for details).   Patient presents to the emergency department with return of tinea type rash to the left upper thigh.  Lesion initially diagnosed as resolving but not completely gone.  With return of lesions plan to continue topical therapy with prescription refilled.  Will add fluconazole once weekly.    ____________________________________________  FINAL CLINICAL IMPRESSION(S) / ED DIAGNOSES  Final diagnoses:  Tinea corporis     NEW OUTPATIENT MEDICATIONS STARTED DURING THIS VISIT:  New Prescriptions   FLUCONAZOLE (DIFLUCAN) 150 MG TABLET    Take 1 tablet (150 mg total) by mouth once a week for 4 doses.   KETOCONAZOLE (NIZORAL) 2 % CREAM  Apply 1 application topically 2 (two) times daily.    Note:  This document was prepared using Dragon voice recognition software and may include unintentional dictation errors.  Nanda Quinton, MD, [facility] Emergency Medicine    Long, [facility], MD 11/18/18 [phone]

## 2018-11-18 NOTE — ED Notes (Signed)
ED Provider at bedside. 

## 2018-11-18 NOTE — Discharge Instructions (Signed)
You were seen in the emergency department today with return of a fungal type infection to the skin.  I am prescribing both a cream and a pill.  He take the pill once per week as instructed for a total of 4 weeks.  Apply the cream twice daily for up to 4 weeks. Follow with your PCP.

## 2018-11-18 NOTE — ED Triage Notes (Signed)
Pts son was diagnosed with a fungus on his skin last week and now pt has a red patch on her left upper leg that looks similar.

## 2019-04-04 ENCOUNTER — Emergency Department (HOSPITAL_BASED_OUTPATIENT_CLINIC_OR_DEPARTMENT_OTHER)
Admission: EM | Admit: 2019-04-04 | Discharge: 2019-04-04 | Disposition: A | Discharge status: Home / Self Care | Payer: Self-pay | Attending: Emergency Medicine | Admitting: Emergency Medicine

## 2019-04-04 ENCOUNTER — Other Ambulatory Visit: Payer: Self-pay

## 2019-04-04 ENCOUNTER — Encounter (HOSPITAL_BASED_OUTPATIENT_CLINIC_OR_DEPARTMENT_OTHER): Payer: Self-pay

## 2019-04-04 DIAGNOSIS — N939 Abnormal uterine and vaginal bleeding, unspecified: Secondary | ICD-10-CM

## 2019-04-04 DIAGNOSIS — B9689 Other specified bacterial agents as the cause of diseases classified elsewhere: Secondary | ICD-10-CM

## 2019-04-04 DIAGNOSIS — N760 Acute vaginitis: Secondary | ICD-10-CM | POA: Insufficient documentation

## 2019-04-04 LAB — URINALYSIS, ROUTINE W REFLEX MICROSCOPIC
Bilirubin Urine: NEGATIVE
Glucose, UA: NEGATIVE mg/dL
Hgb urine dipstick: NEGATIVE
Ketones, ur: NEGATIVE mg/dL
Leukocytes,Ua: NEGATIVE
Nitrite: NEGATIVE
Protein, ur: NEGATIVE mg/dL
Specific Gravity, Urine: 1.02 (ref 1.005–1.030)
pH: 7.5 (ref 5.0–8.0)

## 2019-04-04 LAB — PREGNANCY, URINE: Preg Test, Ur: NEGATIVE

## 2019-04-04 LAB — WET PREP, GENITAL
Sperm: NONE SEEN
Trich, Wet Prep: NONE SEEN
Yeast Wet Prep HPF POC: NONE SEEN

## 2019-04-04 MED ORDER — METRONIDAZOLE 500 MG PO TABS: 500.0000 mg | ORAL_TABLET | Freq: Two times a day (BID) | ORAL | 0 refills | Status: DC

## 2019-04-04 NOTE — ED Notes (Signed)
ED Provider at bedside. 

## 2019-04-04 NOTE — ED Triage Notes (Signed)
Pt c/o vaginal "blood clots" x 2 days-states LMP 2/3-NAD-steady gait

## 2019-04-04 NOTE — Discharge Instructions (Signed)
Follow-up with OB/GYN or PCP for reevaluation.  If you start bleeding peripad in less than 2 hours please seek reevaluation the emergency department.

## 2019-04-04 NOTE — ED Provider Notes (Signed)
[facility] EMERGENCY DEPARTMENT Provider Note   CSN: [id number] Arrival date & time: 04/04/19  1857     History Chief Complaint  Patient presents with  . Vaginal Bleeding    Rebecca Pitts is a 29 y.o. female with no significant past medical history who presents for evaluation of dental bleeding.  Patient states she has had intermittent, dark brown vaginal bleeding over the last 2 days.  LMP 03/22/2018.  Patient states she is wearing a pain liner and has been changing this every 6 hours.  Patient states she normally only has clots when she wipes.  She denies fever, chills, nausea, vomiting, abdominal pain, pelvic pain, vaginal discharge, vaginal pruritus, concerns for STDs.  Denies additional aggravating or alleviating factors.  Denies chance of pregnancy.  She is sexually active.   History obtained from patient and past medical records.  No interpreter is used.  HPI     History reviewed. No pertinent past medical history.  There are no problems to display for this patient.   History reviewed. No pertinent surgical history.   OB History   No obstetric history on file.     No family history on file.  Social History   Tobacco Use  . Smoking status: Never Smoker  . Smokeless tobacco: Never Used  Substance Use Topics  . Alcohol use: Yes    Comment: occ  . Drug use: Yes    Types: Marijuana    Home Medications Prior to Admission medications   Medication Sig Start Date End Date Taking? Authorizing Provider  metroNIDAZOLE (FLAGYL) 500 MG tablet Take 1 tablet (500 mg total) by mouth 2 (two) times daily. 04/04/19   Ollivander See A, PA-C    Allergies    Patient has no known allergies.  Review of Systems   Review of Systems  Constitutional: Negative.   HENT: Negative.   Respiratory: Negative.   Cardiovascular: Negative.   Gastrointestinal: Negative.   Genitourinary: Positive for menstrual problem and vaginal bleeding. Negative for decreased urine  volume, difficulty urinating, dyspareunia, dysuria, enuresis, flank pain, frequency, genital sores, hematuria, pelvic pain, urgency, vaginal discharge and vaginal pain.  Musculoskeletal: Negative.   Skin: Negative.   Neurological: Negative.   All other systems reviewed and are negative.   Physical Exam Updated Vital Signs BP 125/72 (BP Location: Left Arm)   Pulse 89   Temp 98.4 F (36.9 C) (Oral)   Resp 20   Ht 5\' 5"  (1.651 m)   Wt 88 kg   LMP 03/23/2019   SpO2 100%   BMI 32.28 kg/m   Physical Exam Vitals and nursing note reviewed. Exam conducted with a chaperone present.  Constitutional:      General: She is not in acute distress.    Appearance: She is well-developed. She is not ill-appearing, toxic-appearing or diaphoretic.  HENT:     Head: Normocephalic and atraumatic.     Nose: Nose normal.     Mouth/Throat:     Mouth: Mucous membranes are moist.     Pharynx: Oropharynx is clear.  Eyes:     Pupils: Pupils are equal, round, and reactive to light.  Cardiovascular:     Rate and Rhythm: Normal rate.     Pulses: Normal pulses.     Heart sounds: Normal heart sounds.  Pulmonary:     Effort: Pulmonary effort is normal. No respiratory distress.     Breath sounds: Normal breath sounds.  Abdominal:     General: Bowel sounds are normal. There  is no distension.  Genitourinary:    Comments: Normal appearing external female genitalia without rashes or lesions, normal vaginal epithelium. Normal appearing cervix with old minimal dark blood at cervical os. No discharge or petechiae. Cervical os is closed. Mild odor. Bimanual: No CMT, nontender.  No palpable adnexal masses or tenderness. Uterus midline and not fixed. Rectovaginal exam was deferred.  No cystocele or rectocele noted. No pelvic lymphadenopathy noted. Wet prep was obtained.  Cultures for gonorrhea and chlamydia collected. Exam performed with chaperone in room. Musculoskeletal:        General: Normal range of motion.      Cervical back: Normal range of motion.  Skin:    General: Skin is warm and dry.  Neurological:     Mental Status: She is alert.    ED Results / Procedures / Treatments   Labs (all labs ordered are listed, but only abnormal results are displayed) Labs Reviewed  WET PREP, GENITAL - Abnormal; Notable for the following components:      Result Value   Clue Cells Wet Prep HPF POC PRESENT (*)    WBC, Wet Prep HPF POC MODERATE (*)    All other components within normal limits  URINE CULTURE  PREGNANCY, URINE  URINALYSIS, ROUTINE W REFLEX MICROSCOPIC  GC/CHLAMYDIA PROBE AMP ([place]) NOT AT [facility]    EKG None  Radiology No results found.  Procedures Procedures (including critical care time)  Medications Ordered in ED Medications - No data to display  ED Course  I have reviewed the triage vital signs and the nursing notes.  Pertinent labs & imaging results that were available during my care of the patient were reviewed by me and considered in my medical decision making (see chart for details).  29 year old female appears otherwise well presents for evaluation of vaginal bleeding.  Initial menstrual cycle 03/23/2019.  Patient states she has had some dark brown clots size of pencil heads over the last 2 days.  She is changing a new liner every 6 hours.  Patient states she does normally occurs when she wipes.  No pelvic pain, abdominal pain.  No discharge.  No concerns for STDs.  Shared decision making to obtain imaging.  Patient declines at this time.  Given she is without pain I feel this is reasonable.  She has no tachycardia or dizziness.  Low suspicion for anemia.  GU exam with some dark minimal blood at cervical os.  No CMT or adnexal tenderness.  Wet prep with BV.  Low suspicion for torsion, TOA, PID. UA negative for UTI. Gonorrhea, Chlamydia pending.  Patient will follow up with OB/GYN for abnormal uterine bleeding.  Do not think she needs to start exogenous hormone use given she  has only spotting.  Make referral to OB/GYN.  She is to return for any new or worsening symptoms.  The patient has been appropriately medically screened and/or stabilized in the ED. I have low suspicion for any other emergent medical condition which would require further screening, evaluation or treatment in the ED or require inpatient management.  Patient is hemodynamically stable and in no acute distress.  Patient able to ambulate in department prior to ED.  Evaluation does not show acute pathology that would require ongoing or additional emergent interventions while in the emergency department or further inpatient treatment.  I have discussed the diagnosis with the patient and answered all questions.  Pain is been managed while in the emergency department and patient has no further complaints prior to  discharge.  Patient is comfortable with plan discussed in room and is stable for discharge at this time.  I have discussed strict return precautions for returning to the emergency department.  Patient was encouraged to follow-up with PCP/specialist refer to at discharge.    MDM Rules/Calculators/A&P                       Final Clinical Impression(s) / ED Diagnoses Final diagnoses:  Abnormal uterine bleeding  BV (bacterial vaginosis)    Rx / DC Orders ED Discharge Orders         Ordered    metroNIDAZOLE (FLAGYL) 500 MG tablet  2 times daily     04/04/19 2032           Kathalene Sporer A, PA-C 04/04/19 2033    Arby Barrette, MD 04/15/19 1237

## 2019-04-05 LAB — URINE CULTURE: Culture: <10000 — AB

## 2019-04-06 LAB — GC/CHLAMYDIA PROBE AMP (~~LOC~~) NOT AT ARMC
Chlamydia: NEGATIVE
Neisseria Gonorrhea: NEGATIVE

## 2019-07-23 ENCOUNTER — Other Ambulatory Visit: Payer: Self-pay

## 2019-07-23 ENCOUNTER — Emergency Department (HOSPITAL_BASED_OUTPATIENT_CLINIC_OR_DEPARTMENT_OTHER)
Admission: EM | Admit: 2019-07-23 | Discharge: 2019-07-23 | Disposition: A | Discharge status: Home / Self Care | Payer: Self-pay | Attending: Emergency Medicine | Admitting: Emergency Medicine

## 2019-07-23 ENCOUNTER — Encounter (HOSPITAL_BASED_OUTPATIENT_CLINIC_OR_DEPARTMENT_OTHER): Payer: Self-pay | Admitting: Emergency Medicine

## 2019-07-23 DIAGNOSIS — N898 Other specified noninflammatory disorders of vagina: Secondary | ICD-10-CM | POA: Insufficient documentation

## 2019-07-23 DIAGNOSIS — Z5321 Procedure and treatment not carried out due to patient leaving prior to being seen by health care provider: Secondary | ICD-10-CM | POA: Insufficient documentation

## 2019-07-23 LAB — URINALYSIS, MICROSCOPIC (REFLEX): RBC / HPF: NONE SEEN RBC/hpf (ref 0–5)

## 2019-07-23 LAB — URINALYSIS, ROUTINE W REFLEX MICROSCOPIC
Bilirubin Urine: NEGATIVE
Glucose, UA: NEGATIVE mg/dL
Hgb urine dipstick: NEGATIVE
Ketones, ur: NEGATIVE mg/dL
Nitrite: NEGATIVE
Protein, ur: NEGATIVE mg/dL
Specific Gravity, Urine: 1.015 (ref 1.005–1.030)
pH: 7.5 (ref 5.0–8.0)

## 2019-07-23 LAB — PREGNANCY, URINE: Preg Test, Ur: NEGATIVE

## 2019-07-23 NOTE — ED Triage Notes (Signed)
Vaginal discharge x 2 days. States her sex partner has chlamydia.

## 2019-07-24 ENCOUNTER — Encounter (HOSPITAL_BASED_OUTPATIENT_CLINIC_OR_DEPARTMENT_OTHER): Payer: Self-pay | Admitting: Emergency Medicine

## 2019-07-24 ENCOUNTER — Emergency Department (HOSPITAL_BASED_OUTPATIENT_CLINIC_OR_DEPARTMENT_OTHER)
Admission: EM | Admit: 2019-07-24 | Discharge: 2019-07-24 | Disposition: A | Discharge status: Home / Self Care | Payer: Self-pay | Attending: Emergency Medicine | Admitting: Emergency Medicine

## 2019-07-24 ENCOUNTER — Other Ambulatory Visit: Payer: Self-pay

## 2019-07-24 DIAGNOSIS — Z202 Contact with and (suspected) exposure to infections with a predominantly sexual mode of transmission: Secondary | ICD-10-CM | POA: Insufficient documentation

## 2019-07-24 DIAGNOSIS — N898 Other specified noninflammatory disorders of vagina: Secondary | ICD-10-CM | POA: Insufficient documentation

## 2019-07-24 LAB — WET PREP, GENITAL
Clue Cells Wet Prep HPF POC: NONE SEEN
Sperm: NONE SEEN
Trich, Wet Prep: NONE SEEN
Yeast Wet Prep HPF POC: NONE SEEN

## 2019-07-24 LAB — HIV ANTIBODY (ROUTINE TESTING W REFLEX): HIV Screen 4th Generation wRfx: NONREACTIVE

## 2019-07-24 MED ORDER — DOXYCYCLINE HYCLATE 100 MG PO CAPS: 100.0000 mg | ORAL_CAPSULE | Freq: Two times a day (BID) | ORAL | 0 refills | Status: DC

## 2019-07-24 MED ORDER — CEFTRIAXONE SODIUM 500 MG IJ SOLR
500.0000 mg | Freq: Once | INTRAMUSCULAR | Status: AC
  Administered 2019-07-24: 500 mg via INTRAMUSCULAR
  Filled 2019-07-24: qty 500

## 2019-07-24 NOTE — ED Triage Notes (Signed)
Vaginal discharge x 3 days. States her sex partner has chlamydia. Was here yesterday but left due to wait. UA done yesterday.

## 2019-07-24 NOTE — ED Provider Notes (Signed)
[facility] EMERGENCY DEPARTMENT Provider Note   CSN: [id number] Arrival date & time: 07/24/19  0913     History Chief Complaint  Patient presents with  . Exposure to STD    Rebecca Pitts is a 29 y.o. female.  HPI Patient presents with vaginal discharge. Has had for around 3 days. States her sexual partner was diagnosed with chlamydia recently. No dysuria. No fevers. Checked in yesterday but wait was too long did not stay. Negative pregnancy test at that time. Still a mild greenish vaginal discharge.    History reviewed. No pertinent past medical history.  There are no problems to display for this patient.   History reviewed. No pertinent surgical history.   OB History   No obstetric history on file.     No family history on file.  Social History   Tobacco Use  . Smoking status: Never Smoker  . Smokeless tobacco: Never Used  Substance Use Topics  . Alcohol use: Yes    Comment: occ  . Drug use: Yes    Types: Marijuana    Home Medications Prior to Admission medications   Medication Sig Start Date End Date Taking? Authorizing Provider  doxycycline (VIBRAMYCIN) 100 MG capsule Take 1 capsule (100 mg total) by mouth 2 (two) times daily. 07/24/19   Benjiman Core, MD  metroNIDAZOLE (FLAGYL) 500 MG tablet Take 1 tablet (500 mg total) by mouth 2 (two) times daily. 04/04/19   Henderly, Britni A, PA-C    Allergies    Patient has no known allergies.  Review of Systems   Review of Systems  Constitutional: Negative for appetite change and fever.  HENT: Negative for congestion.   Cardiovascular: Negative for chest pain.  Gastrointestinal: Negative for abdominal pain.  Genitourinary: Positive for vaginal discharge. Negative for dysuria and vaginal bleeding.  Skin: Negative for rash.  Neurological: Negative for weakness.    Physical Exam Updated Vital Signs BP 103/78 (BP Location: Right Arm)   Pulse 86   Temp 98.5 F (36.9 C) (Oral)   Resp 16    LMP 07/04/2019   SpO2 99%   Physical Exam Vitals and nursing note reviewed.  HENT:     Head: Normocephalic.  Eyes:     Pupils: Pupils are equal, round, and reactive to light.  Cardiovascular:     Rate and Rhythm: Normal rate.  Pulmonary:     Effort: No respiratory distress.  Abdominal:     Tenderness: There is no abdominal tenderness.  Genitourinary:    Comments: Minimal vaginal discharge. No adnexal tenderness. No cervical motion tenderness. No external skin lesions. Musculoskeletal:     Right lower leg: No edema.     Left lower leg: No edema.  Skin:    General: Skin is warm.     Capillary Refill: Capillary refill takes less than 2 seconds.  Neurological:     Mental Status: She is alert and oriented to person, place, and time.     ED Results / Procedures / Treatments   Labs (all labs ordered are listed, but only abnormal results are displayed) Labs Reviewed  WET PREP, GENITAL - Abnormal; Notable for the following components:      Result Value   WBC, Wet Prep HPF POC MANY (*)    All other components within normal limits  RPR  HIV ANTIBODY (ROUTINE TESTING W REFLEX)  GC/CHLAMYDIA PROBE AMP ([place]) NOT AT [facility]    EKG None  Radiology No results found.  Procedures Procedures (including critical  care time)  Medications Ordered in ED Medications  cefTRIAXone (ROCEPHIN) injection 500 mg (500 mg Intramuscular Given 07/24/19 0955)    ED Course  I have reviewed the triage vital signs and the nursing notes.  Pertinent labs & imaging results that were available during my care of the patient were reviewed by me and considered in my medical decision making (see chart for details).    MDM Rules/Calculators/A&P                      Patient with STD exposure. Reportedly exposed to chlamydia. Rather benign pelvic exam. some white cells on wet prep. No dysuria. Negative pregnancy test yesterday. Treated empirically with Rocephin and doxycycline. RPR and HIV also  tested. Discharge home. Final Clinical Impression(s) / ED Diagnoses Final diagnoses:  STD exposure    Rx / DC Orders ED Discharge Orders         Ordered    doxycycline (VIBRAMYCIN) 100 MG capsule  2 times daily     07/24/19 1017           Davonna Belling, MD 07/24/19 [phone]

## 2019-07-25 LAB — RPR: RPR Ser Ql: NONREACTIVE

## 2019-07-25 LAB — GC/CHLAMYDIA PROBE AMP (~~LOC~~) NOT AT ARMC
Chlamydia: NEGATIVE
Comment: NEGATIVE
Comment: NORMAL
Neisseria Gonorrhea: NEGATIVE

## 2019-08-26 ENCOUNTER — Emergency Department (HOSPITAL_BASED_OUTPATIENT_CLINIC_OR_DEPARTMENT_OTHER)
Admission: EM | Admit: 2019-08-26 | Discharge: 2019-08-26 | Disposition: A | Discharge status: Home / Self Care | Payer: Self-pay

## 2019-08-26 ENCOUNTER — Other Ambulatory Visit: Payer: Self-pay

## 2019-08-27 ENCOUNTER — Encounter (HOSPITAL_BASED_OUTPATIENT_CLINIC_OR_DEPARTMENT_OTHER): Payer: Self-pay | Admitting: Emergency Medicine

## 2019-08-27 ENCOUNTER — Other Ambulatory Visit: Payer: Self-pay

## 2019-08-27 ENCOUNTER — Emergency Department (HOSPITAL_BASED_OUTPATIENT_CLINIC_OR_DEPARTMENT_OTHER)
Admission: EM | Admit: 2019-08-27 | Discharge: 2019-08-27 | Disposition: A | Discharge status: Home / Self Care | Payer: Self-pay | Attending: Emergency Medicine | Admitting: Emergency Medicine

## 2019-08-27 DIAGNOSIS — Z202 Contact with and (suspected) exposure to infections with a predominantly sexual mode of transmission: Secondary | ICD-10-CM | POA: Insufficient documentation

## 2019-08-27 DIAGNOSIS — A549 Gonococcal infection, unspecified: Secondary | ICD-10-CM | POA: Insufficient documentation

## 2019-08-27 MED ORDER — CEFTRIAXONE SODIUM 500 MG IJ SOLR
500.0000 mg | Freq: Once | INTRAMUSCULAR | Status: AC
  Administered 2019-08-27: 500 mg via INTRAMUSCULAR
  Filled 2019-08-27: qty 500

## 2019-08-27 NOTE — Discharge Instructions (Signed)
Return for fever, worsening pain.  No sexual contact for at least two weeks post treatment

## 2019-08-27 NOTE — ED Notes (Signed)
Pt discharged to home NAD.  

## 2019-08-27 NOTE — ED Provider Notes (Signed)
[facility] EMER[facility]Y DEPARTMENT Provider Note   CSN: [id number] Arrival date & time: 08/27/19  0453     History Chief Complaint  Patient presents with  . Exposure to STD    Rebecca Pitts is a 29 y.o. female.  29 yo F with a chief complaints of contracting gonorrhea.  Has been having some lower abdominal cramping and some vaginal discharge.  Went to the health department and tested positive for gonorrhea.  Unfortunately they are unable to get her in for treatment until next week and so she feels that she needs to be treated sooner than that and so came to the ED today.  Denies any significant worsening of her symptoms denies fever denies abdominal pain.  The history is provided by the patient.  Exposure to STD This is a new problem. The current episode started more than 2 days ago. The problem occurs constantly. The problem has not changed since onset.Pertinent negatives include no chest pain, no headaches and no shortness of breath. Nothing aggravates the symptoms. Nothing relieves the symptoms. She has tried nothing for the symptoms. The treatment provided no relief.       No past medical history on file.  There are no problems to display for this patient.   History reviewed. No pertinent surgical history.   OB History   No obstetric history on file.     No family history on file.  Social History   Tobacco Use  . Smoking status: Never Smoker  . Smokeless tobacco: Never Used  Vaping Use  . Vaping Use: Never used  Substance Use Topics  . Alcohol use: Yes    Comment: occ  . Drug use: Yes    Types: Marijuana    Home Medications Prior to Admission medications   Medication Sig Start Date End Date Taking? Authorizing Provider  doxycycline (VIBRAMYCIN) 100 MG capsule Take 1 capsule (100 mg total) by mouth 2 (two) times daily. 07/24/19   Benjiman Core, MD  metroNIDAZOLE (FLAGYL) 500 MG tablet Take 1 tablet (500 mg total) by mouth 2 (two) times  daily. 04/04/19   Henderly, Britni A, PA-C    Allergies    Patient has no known allergies.  Review of Systems   Review of Systems  Constitutional: Negative for chills and fever.  HENT: Negative for congestion and rhinorrhea.   Eyes: Negative for redness and visual disturbance.  Respiratory: Negative for shortness of breath and wheezing.   Cardiovascular: Negative for chest pain and palpitations.  Gastrointestinal: Negative for nausea and vomiting.  Genitourinary: Positive for pelvic pain and vaginal discharge. Negative for dysuria and urgency.  Musculoskeletal: Negative for arthralgias and myalgias.  Skin: Negative for pallor and wound.  Neurological: Negative for dizziness and headaches.    Physical Exam Updated Vital Signs BP (!) 128/47 (BP Location: Right Arm)   Pulse 77   Temp 98.1 F (36.7 C) (Oral)   Resp 16   LMP 08/08/2019   SpO2 100%   Physical Exam Vitals and nursing note reviewed.  Constitutional:      General: She is not in acute distress.    Appearance: She is well-developed. She is not diaphoretic.  HENT:     Head: Normocephalic and atraumatic.  Eyes:     Pupils: Pupils are equal, round, and reactive to light.  Cardiovascular:     Rate and Rhythm: Normal rate and regular rhythm.     Heart sounds: No murmur heard.  No friction rub. No gallop.  Pulmonary:     Effort: Pulmonary effort is normal.     Breath sounds: No wheezing or rales.  Abdominal:     General: There is no distension.     Palpations: Abdomen is soft.     Tenderness: There is no abdominal tenderness.     Comments: Benign abdominal exam  Musculoskeletal:        General: No tenderness.     Cervical back: Normal range of motion and neck supple.  Skin:    General: Skin is warm and dry.  Neurological:     Mental Status: She is alert and oriented to person, place, and time.  Psychiatric:        Behavior: Behavior normal.     ED Results / Procedures / Treatments   Labs (all labs  ordered are listed, but only abnormal results are displayed) Labs Reviewed - No data to display  EKG None  Radiology No results found.  Procedures Procedures (including critical care time)  Medications Ordered in ED Medications  cefTRIAXone (ROCEPHIN) injection 500 mg (has no administration in time range)    ED Course  I have reviewed the triage vital signs and the nursing notes.  Pertinent labs & imaging results that were available during my care of the patient were reviewed by me and considered in my medical decision making (see chart for details).    MDM Rules/Calculators/A&P                          29 year old female with a chief complaints of contracting gonorrhea.  Cramping and vaginal discharge.  Will treat for gonorrhea here.  Have her follow-up with her family doctor or the health department.  5:23 AM:  I have discussed the diagnosis/risks/treatment options with the patient and believe the pt to be eligible for discharge home to follow-up with PCP. We also discussed returning to the ED immediately if new or worsening sx occur. We discussed the sx which are most concerning (e.g., sudden worsening pain, fever, inability to tolerate by mouth) that necessitate immediate return. Medications administered to the patient during their visit and any new prescriptions provided to the patient are listed below.  Medications given during this visit Medications  cefTRIAXone (ROCEPHIN) injection 500 mg (has no administration in time range)     The patient appears reasonably screen and/or stabilized for discharge and I doubt any other medical condition or other [facility] requiring further screening, evaluation, or treatment in the ED at this time prior to discharge.   Final Clinical Impression(s) / ED Diagnoses Final diagnoses:  Gonorrhea    Rx / DC Orders ED Discharge Orders    None       Melene Plan, DO 08/27/19 6945

## 2019-08-27 NOTE — ED Triage Notes (Signed)
Pt presents with wanting treatment for STD, states she tested positive and is unable to get appointment at health department for another week.

## 2019-11-08 ENCOUNTER — Emergency Department (HOSPITAL_BASED_OUTPATIENT_CLINIC_OR_DEPARTMENT_OTHER)
Admission: EM | Admit: 2019-11-08 | Discharge: 2019-11-08 | Disposition: A | Discharge status: Home / Self Care | Payer: HRSA Program | Attending: Emergency Medicine | Admitting: Emergency Medicine

## 2019-11-08 ENCOUNTER — Encounter (HOSPITAL_BASED_OUTPATIENT_CLINIC_OR_DEPARTMENT_OTHER): Payer: Self-pay | Admitting: Emergency Medicine

## 2019-11-08 ENCOUNTER — Other Ambulatory Visit: Payer: Self-pay

## 2019-11-08 ENCOUNTER — Emergency Department (HOSPITAL_BASED_OUTPATIENT_CLINIC_OR_DEPARTMENT_OTHER): Payer: HRSA Program

## 2019-11-08 DIAGNOSIS — R197 Diarrhea, unspecified: Secondary | ICD-10-CM | POA: Diagnosis not present

## 2019-11-08 DIAGNOSIS — J069 Acute upper respiratory infection, unspecified: Secondary | ICD-10-CM

## 2019-11-08 DIAGNOSIS — Z20822 Contact with and (suspected) exposure to covid-19: Secondary | ICD-10-CM | POA: Insufficient documentation

## 2019-11-08 DIAGNOSIS — J029 Acute pharyngitis, unspecified: Secondary | ICD-10-CM | POA: Diagnosis present

## 2019-11-08 LAB — SARS CORONAVIRUS 2 BY RT PCR (HOSPITAL ORDER, PERFORMED IN ~~LOC~~ HOSPITAL LAB): SARS Coronavirus 2: NEGATIVE

## 2019-11-08 NOTE — Discharge Instructions (Signed)

## 2019-11-08 NOTE — ED Provider Notes (Signed)
[facility] EMERGENCY DEPARTMENT Provider Note   CSN: [id number] Arrival date & time: 11/08/19  0848     History Chief Complaint  Patient presents with  . Sore Throat    Rebecca Pitts is a 29 y.o. female.  HPI   Pt is a 29 y/o female who presents to the ED today for eval of a sore throat, diarrhea, right sided chest pain, and diarrhea that started 4 days ago. States chest pain located on the right side and states it feels like a sharp cramp that comes intermittently. When present, pain lasts for about 1 minute.   She has an intermittent cough, diarrhea. Denies rhinorrhea, congestion, fevers, myalgias, vomiting.  Denies leg pain/swelling, hemoptysis, recent surgery, recent long travel, hormone use, personal hx of cancer, or hx of DVT/PE.   Reports that her mom and stepfather just tested positive for COVID. She has not been vaccinated.  History reviewed. No pertinent past medical history.  There are no problems to display for this patient.  History reviewed. No pertinent surgical history.   OB History   No obstetric history on file.     History reviewed. No pertinent family history.  Social History   Tobacco Use  . Smoking status: Never Smoker  . Smokeless tobacco: Never Used  Vaping Use  . Vaping Use: Never used  Substance Use Topics  . Alcohol use: Yes    Comment: occ  . Drug use: Yes    Types: Marijuana    Home Medications Prior to Admission medications   Medication Sig Start Date End Date Taking? Authorizing Provider  doxycycline (VIBRAMYCIN) 100 MG capsule Take 1 capsule (100 mg total) by mouth 2 (two) times daily. 07/24/19   Benjiman Core, MD  metroNIDAZOLE (FLAGYL) 500 MG tablet Take 1 tablet (500 mg total) by mouth 2 (two) times daily. 04/04/19   Henderly, Britni A, PA-C    Allergies    Patient has no known allergies.  Review of Systems   Review of Systems  Constitutional: Negative for chills and fever.  HENT: Positive for sore  throat. Negative for ear pain.   Eyes: Negative for visual disturbance.  Respiratory: Positive for cough. Negative for shortness of breath.   Cardiovascular: Positive for chest pain. Negative for leg swelling.  Gastrointestinal: Positive for diarrhea. Negative for abdominal pain, constipation, nausea and vomiting.  Genitourinary: Negative for dysuria and hematuria.  Musculoskeletal: Negative for back pain.  Skin: Negative for rash.  Neurological: Negative for headaches.  All other systems reviewed and are negative.   Physical Exam Updated Vital Signs BP 125/84 (BP Location: Right Arm)   Pulse 90   Temp 98.2 F (36.8 C) (Oral)   Resp 16   Ht 5\' 6"  (1.676 m)   Wt 89.7 kg   LMP 10/30/2019   SpO2 100%   BMI 31.93 kg/m   Physical Exam Vitals and nursing note reviewed.  Constitutional:      General: She is not in acute distress.    Appearance: She is well-developed.  HENT:     Head: Normocephalic and atraumatic.     Nose: No congestion or rhinorrhea.     Mouth/Throat:     Pharynx: Uvula midline. No pharyngeal swelling, oropharyngeal exudate, posterior oropharyngeal erythema or uvula swelling.     Tonsils: No tonsillar exudate or tonsillar abscesses. 0 on the right. 0 on the left.  Eyes:     Conjunctiva/sclera: Conjunctivae normal.  Cardiovascular:     Rate and Rhythm: Normal rate and  regular rhythm.     Heart sounds: Normal heart sounds. No murmur heard.   Pulmonary:     Effort: Pulmonary effort is normal. No respiratory distress.     Breath sounds: Normal breath sounds. No wheezing or rales.  Chest:     Chest wall: No tenderness.  Abdominal:     Palpations: Abdomen is soft.     Tenderness: There is no abdominal tenderness.  Musculoskeletal:     Cervical back: Neck supple.  Lymphadenopathy:     Cervical: No cervical adenopathy.  Skin:    General: Skin is warm and dry.  Neurological:     Mental Status: She is alert.     ED Results / Procedures / Treatments     Labs (all labs ordered are listed, but only abnormal results are displayed) Labs Reviewed  SARS CORONAVIRUS 2 BY RT PCR (HOSPITAL ORDER, PERFORMED IN [facility] LAB)    EKG None  Radiology DG Chest Portable 1 View  Result Date: 11/08/2019 CLINICAL DATA:  Cough.  COVID exposure. EXAM: PORTABLE CHEST 1 VIEW COMPARISON:  CT chest 04/27/2016.  Chest x-ray 04/27/2016. FINDINGS: Mediastinum and hilar structures normal. Heart size normal. No focal infiltrate. No pleural effusion or pneumothorax. IMPRESSION: No acute cardiopulmonary disease identified. Electronically Signed   By: Maisie Fus  Register   On: 11/08/2019 09:24    Procedures Procedures (including critical care time)  Medications Ordered in ED Medications - No data to display  ED Course  I have reviewed the triage vital signs and the nursing notes.  Pertinent labs & imaging results that were available during my care of the patient were reviewed by me and considered in my medical decision making (see chart for details).    MDM Rules/Calculators/A&P                          Pt with uri sxs. Pt CXR reviewed/interpreted and negative for acute infiltrate. Patients symptoms are consistent with URI, likely viral etiology. COVID neg. CENTOR score 0, doubt strep. Discussed that antibiotics are not indicated for viral infections. Pt will be discharged with symptomatic treatment.  Verbalizes understanding and is agreeable with plan. Pt is hemodynamically stable & in NAD prior to dc.   Final Clinical Impression(s) / ED Diagnoses Final diagnoses:  Upper respiratory tract infection, unspecified type    Rx / DC Orders ED Discharge Orders    None       Rayne Du 11/08/19 1112    Linwood Dibbles, MD 11/09/19 [phone]

## 2019-11-08 NOTE — ED Triage Notes (Signed)
Pt arrives pov with c/o sore throat, right side CP and shob. Pt reports family Covid+, wants to be tested. V/S WNL, ambulatory with normal gait to triage.

## 2019-11-08 NOTE — ED Notes (Signed)
ED Provider at bedside. 

## 2019-11-08 NOTE — ED Notes (Signed)
Pt discharged to home. Discharge instructions have been discussed with patient and/or family members. Pt verbally acknowledges understanding d/c instructions, and endorses comprehension to checkout at registration before leaving.  °

## 2019-12-11 ENCOUNTER — Emergency Department (HOSPITAL_BASED_OUTPATIENT_CLINIC_OR_DEPARTMENT_OTHER)
Admission: EM | Admit: 2019-12-11 | Discharge: 2019-12-11 | Disposition: A | Discharge status: Home / Self Care | Payer: Self-pay | Attending: Emergency Medicine | Admitting: Emergency Medicine

## 2019-12-11 ENCOUNTER — Emergency Department (HOSPITAL_BASED_OUTPATIENT_CLINIC_OR_DEPARTMENT_OTHER): Payer: Self-pay

## 2019-12-11 ENCOUNTER — Encounter (HOSPITAL_BASED_OUTPATIENT_CLINIC_OR_DEPARTMENT_OTHER): Payer: Self-pay | Admitting: Emergency Medicine

## 2019-12-11 DIAGNOSIS — N201 Calculus of ureter: Secondary | ICD-10-CM | POA: Insufficient documentation

## 2019-12-11 LAB — URINALYSIS, ROUTINE W REFLEX MICROSCOPIC
Bilirubin Urine: NEGATIVE
Glucose, UA: NEGATIVE mg/dL
Ketones, ur: NEGATIVE mg/dL
Nitrite: NEGATIVE
Protein, ur: NEGATIVE mg/dL
Specific Gravity, Urine: 1.02 (ref 1.005–1.030)
pH: 6.5 (ref 5.0–8.0)

## 2019-12-11 LAB — CBC
HCT: 38.4 % (ref 36.0–46.0)
Hemoglobin: 12.6 g/dL (ref 12.0–15.0)
MCH: 27.3 pg (ref 26.0–34.0)
MCHC: 32.8 g/dL (ref 30.0–36.0)
MCV: 83.1 fL (ref 80.0–100.0)
Platelets: 306 10*3/uL (ref 150–400)
RBC: 4.62 MIL/uL (ref 3.87–5.11)
RDW: 14.3 % (ref 11.5–15.5)
WBC: 6.3 10*3/uL (ref 4.0–10.5)
nRBC: 0 % (ref 0.0–0.2)

## 2019-12-11 LAB — COMPREHENSIVE METABOLIC PANEL
ALT: 18 U/L (ref 0–44)
AST: 24 U/L (ref 15–41)
Albumin: 4 g/dL (ref 3.5–5.0)
Alkaline Phosphatase: 77 U/L (ref 38–126)
Anion gap: 10 (ref 5–15)
BUN: 14 mg/dL (ref 6–20)
CO2: 25 mmol/L (ref 22–32)
Calcium: 9.1 mg/dL (ref 8.9–10.3)
Chloride: 100 mmol/L (ref 98–111)
Creatinine, Ser: 0.71 mg/dL (ref 0.44–1.00)
GFR, Estimated: >60 mL/min (ref >60)
Glucose, Bld: 104 mg/dL — ABNORMAL HIGH (ref 70–99)
Potassium: 3.7 mmol/L (ref 3.5–5.1)
Sodium: 135 mmol/L (ref 135–145)
Total Bilirubin: 0.5 mg/dL (ref 0.3–1.2)
Total Protein: 7.7 g/dL (ref 6.5–8.1)

## 2019-12-11 LAB — URINALYSIS, MICROSCOPIC (REFLEX): RBC / HPF: >50 RBC/hpf (ref 0–5)

## 2019-12-11 LAB — LIPASE, BLOOD: Lipase: 32 U/L (ref 11–51)

## 2019-12-11 LAB — PREGNANCY, URINE: Preg Test, Ur: NEGATIVE

## 2019-12-11 MED ORDER — MORPHINE SULFATE (PF) 4 MG/ML IV SOLN
4.0000 mg | Freq: Once | INTRAVENOUS | Status: AC
  Administered 2019-12-11: 4 mg via INTRAVENOUS
  Filled 2019-12-11: qty 1

## 2019-12-11 MED ORDER — KETOROLAC TROMETHAMINE 30 MG/ML IJ SOLN
30.0000 mg | Freq: Once | INTRAMUSCULAR | Status: AC
  Administered 2019-12-11: 30 mg via INTRAVENOUS
  Filled 2019-12-11: qty 1

## 2019-12-11 MED ORDER — TAMSULOSIN HCL 0.4 MG PO CAPS: 0.4000 mg | ORAL_CAPSULE | Freq: Every day | ORAL | 0 refills | Status: AC

## 2019-12-11 MED ORDER — HYDROCODONE-ACETAMINOPHEN 5-325 MG PO TABS: 1.0000 | ORAL_TABLET | Freq: Four times a day (QID) | ORAL | 0 refills | Status: AC | PRN

## 2019-12-11 MED ORDER — SODIUM CHLORIDE 0.9 % IV BOLUS
1000.0000 mL | Freq: Once | INTRAVENOUS | Status: AC
  Administered 2019-12-11: 1000 mL via INTRAVENOUS

## 2019-12-11 NOTE — Discharge Instructions (Addendum)
You were seen in the emergency department for left flank and left lower quadrant abdominal pain.  Your work-up showed that you have a 4 mm kidney stone in the tube going from your kidney to your bladder.  This is likely the cause of your pain.  These usually will pass on their own and we are prescribing you some pain medication and medication to help dilate the tube to make this more likely.  Contact urology if your symptoms are getting worse.  Return to the emergency department if any high fever or uncontrolled pain.

## 2019-12-11 NOTE — ED Provider Notes (Signed)
[facility] EMERGENCY DEPARTMENT Provider Note   CSN: [id number] Arrival date & time: 12/11/19  1811     History Chief Complaint  Patient presents with  . Flank Pain    Rebecca Pitts is a 29 y.o. female.  She has no significant past medical history.  She is having left flank into left lower quadrant abdominal pain sharp stabbing 10 out of 10 intensity that started at 2 PM.  Has come and go since then.  Not associate with any other symptoms.  She also said she has not had a bowel movement in 2 days and feels constipated.  Tried half a bottle of mag citrate without improvement.  No urinary symptoms.  No vaginal bleeding or discharge.  Last menstrual period was 2 weeks ago.  The history is provided by the patient.  Flank Pain This is a new problem. The current episode started 6 to 12 hours ago. The problem occurs hourly. The problem has not changed since onset.Associated symptoms include abdominal pain. Pertinent negatives include no chest pain, no headaches and no shortness of breath. Nothing aggravates the symptoms. Nothing relieves the symptoms. She has tried nothing for the symptoms. The treatment provided no relief.       History reviewed. No pertinent past medical history.  There are no problems to display for this patient.   History reviewed. No pertinent surgical history.   OB History   No obstetric history on file.     No family history on file.  Social History   Tobacco Use  . Smoking status: Never Smoker  . Smokeless tobacco: Never Used  Vaping Use  . Vaping Use: Never used  Substance Use Topics  . Alcohol use: Yes    Comment: occ  . Drug use: Yes    Types: Marijuana    Home Medications Prior to Admission medications   Medication Sig Start Date End Date Taking? Authorizing Provider  doxycycline (VIBRAMYCIN) 100 MG capsule Take 1 capsule (100 mg total) by mouth 2 (two) times daily. 07/24/19   Benjiman Core, MD  metroNIDAZOLE (FLAGYL) 500  MG tablet Take 1 tablet (500 mg total) by mouth 2 (two) times daily. 04/04/19   Henderly, Britni A, PA-C    Allergies    Patient has no known allergies.  Review of Systems   Review of Systems  Constitutional: Negative for fever.  HENT: Negative for sore throat.   Eyes: Negative for visual disturbance.  Respiratory: Negative for shortness of breath.   Cardiovascular: Negative for chest pain.  Gastrointestinal: Positive for abdominal pain and constipation. Negative for nausea and vomiting.  Genitourinary: Positive for flank pain. Negative for dysuria, vaginal bleeding and vaginal discharge.  Musculoskeletal: Negative for neck pain.  Skin: Negative for rash.  Neurological: Negative for headaches.    Physical Exam Updated Vital Signs BP 131/87 (BP Location: Left Arm)   Pulse 85   Temp 98.7 F (37.1 C) (Oral)   Resp 18   Ht 5\' 6"  (1.676 m)   Wt 89.8 kg   LMP 11/26/2019   SpO2 98%   BMI 31.96 kg/m   Physical Exam Vitals and nursing note reviewed.  Constitutional:      General: She is not in acute distress.    Appearance: Normal appearance. She is well-developed.  HENT:     Head: Normocephalic and atraumatic.  Eyes:     Conjunctiva/sclera: Conjunctivae normal.  Cardiovascular:     Rate and Rhythm: Normal rate and regular rhythm.  Heart sounds: No murmur heard.   Pulmonary:     Effort: Pulmonary effort is normal. No respiratory distress.     Breath sounds: Normal breath sounds.  Abdominal:     Palpations: Abdomen is soft.     Tenderness: There is no abdominal tenderness. There is no guarding or rebound.  Musculoskeletal:        General: No deformity or signs of injury. Normal range of motion.     Cervical back: Neck supple.  Skin:    General: Skin is warm and dry.     Capillary Refill: Capillary refill takes less than 2 seconds.  Neurological:     General: No focal deficit present.     Mental Status: She is alert.     ED Results / Procedures / Treatments     Labs (all labs ordered are listed, but only abnormal results are displayed) Labs Reviewed  COMPREHENSIVE METABOLIC PANEL - Abnormal; Notable for the following components:      Result Value   Glucose, Bld 104 (*)    All other components within normal limits  URINALYSIS, ROUTINE W REFLEX MICROSCOPIC - Abnormal; Notable for the following components:   APPearance CLOUDY (*)    Hgb urine dipstick LARGE (*)    Leukocytes,Ua TRACE (*)    All other components within normal limits  URINALYSIS, MICROSCOPIC (REFLEX) - Abnormal; Notable for the following components:   Bacteria, UA MANY (*)    All other components within normal limits  LIPASE, BLOOD  CBC  PREGNANCY, URINE    EKG None  Radiology CT Renal Stone Study  Result Date: 12/11/2019 CLINICAL DATA:  Left flank pain. EXAM: CT ABDOMEN AND PELVIS WITHOUT CONTRAST TECHNIQUE: Multidetector CT imaging of the abdomen and pelvis was performed following the standard protocol without IV contrast. COMPARISON:  CT dated April 27, 2016 FINDINGS: Lower chest: The lung bases are clear. The heart size is normal. Hepatobiliary: The liver is normal. Normal gallbladder.There is no biliary ductal dilation. Pancreas: Normal contours without ductal dilatation. No peripancreatic fluid collection. Spleen: Unremarkable. Adrenals/Urinary Tract: --Adrenal glands: Unremarkable. --Right kidney/ureter: There is a punctate nonobstructing stone in the lower pole the right kidney. --Left kidney/ureter: There are multiple stones in the left kidney measuring to approximately 6 mm in upper pole. There is mild left-sided collecting system dilatation that appears to secondary to a 3-4 mm stone in the distal left ureter (axial series 2, image 72). --Urinary bladder: The urinary bladder is decompressed and therefore is poorly evaluated. Stomach/Bowel: --Stomach/Duodenum: No hiatal hernia or other gastric abnormality. Normal duodenal course and caliber. --Small bowel: Unremarkable.  --Colon: Unremarkable. --Appendix: Normal. Vascular/Lymphatic: Normal course and caliber of the major abdominal vessels. --No retroperitoneal lymphadenopathy. --No mesenteric lymphadenopathy. --No pelvic or inguinal lymphadenopathy. Reproductive: Unremarkable Other: No ascites or free air. The abdominal wall is normal. Musculoskeletal. No acute displaced fractures. IMPRESSION: 1. Mild left-sided collecting system dilatation secondary to a 3-4 mm stone in the distal left ureter. 2. Bilateral nephrolithiasis. Electronically Signed   By: Katherine Mantle M.D.   On: 12/11/2019 19:46    Procedures Procedures (including critical care time)  Medications Ordered in ED Medications  ketorolac (TORADOL) 30 MG/ML injection 30 mg (has no administration in time range)  morphine 4 MG/ML injection 4 mg (has no administration in time range)  sodium chloride 0.9 % bolus 1,000 mL (has no administration in time range)    ED Course  I have reviewed the triage vital signs and the nursing notes.  Pertinent  labs & imaging results that were available during my care of the patient were reviewed by me and considered in my medical decision making (see chart for details).  Clinical Course as of Dec 12 1043  Sun Dec 11, 2019  1958 Reviewed results with patient.  She has a 4 mm distal ureteral stone.  Discussed expected course of this with patient.  We will let her get some of her fluids in.   [MB]    Clinical Course User Index [MB] Terrilee Files, MD   MDM Rules/Calculators/A&P                         This patient complains of left flank and left lower quadrant pain; this involves an extensive number of treatment Options and is a complaint that carries with it a high risk of complications and Morbidity. The differential includes renal colic, pyelonephritis, constipation, diverticulitis.  I ordered, reviewed and interpreted labs, which included CBC with normal white count normal hemoglobin, chemistries and  LFTs fairly normal, urinalysis with greater than 50 RBC, 0-5 whites, pregnancy test negative I ordered medication IV fluids Toradol and morphine I ordered imaging studies which included CT renal and I independently    visualized and interpreted imaging which showed 4 mm distal left ureteral stone  Previous records obtained and reviewed in epic, no recent admissions After the interventions stated above, I reevaluated the patient and found patient's pain to be controlled.  I reviewed her work-up with her and she is comfortable plan for expectant management at home.  We will provide her with prescription for pain medicine and Flomax.  Return instructions discussed.  Also discussed recommendations for over-the-counter treatment of her constipation symptoms and the fact that the pain medicine we are giving her for kidney stone may also complicate her constipation.   Final Clinical Impression(s) / ED Diagnoses Final diagnoses:  Ureterolithiasis    Rx / DC Orders ED Discharge Orders         Ordered    HYDROcodone-acetaminophen (NORCO/VICODIN) 5-325 MG tablet  Every 6 hours PRN        12/11/19 2009    tamsulosin (FLOMAX) 0.4 MG CAPS capsule  Daily        12/11/19 2009           Terrilee Files, MD 12/12/19 1047

## 2019-12-11 NOTE — ED Notes (Signed)
Pt to CT

## 2019-12-11 NOTE — ED Triage Notes (Addendum)
L flank pain radiating into LLQ since 230p today. Reports no BM in two days and feels constipated. Denies urinary symptoms.

## 2020-02-05 ENCOUNTER — Other Ambulatory Visit: Payer: Self-pay

## 2020-02-05 ENCOUNTER — Emergency Department (HOSPITAL_BASED_OUTPATIENT_CLINIC_OR_DEPARTMENT_OTHER)
Admission: EM | Admit: 2020-02-05 | Discharge: 2020-02-05 | Disposition: A | Discharge status: Home / Self Care | Payer: Self-pay | Attending: Emergency Medicine | Admitting: Emergency Medicine

## 2020-02-05 ENCOUNTER — Encounter (HOSPITAL_BASED_OUTPATIENT_CLINIC_OR_DEPARTMENT_OTHER): Payer: Self-pay | Admitting: Emergency Medicine

## 2020-02-05 DIAGNOSIS — Z5321 Procedure and treatment not carried out due to patient leaving prior to being seen by health care provider: Secondary | ICD-10-CM | POA: Insufficient documentation

## 2020-02-05 DIAGNOSIS — N899 Noninflammatory disorder of vagina, unspecified: Secondary | ICD-10-CM | POA: Insufficient documentation

## 2020-02-05 LAB — URINALYSIS, ROUTINE W REFLEX MICROSCOPIC
Bilirubin Urine: NEGATIVE
Glucose, UA: NEGATIVE mg/dL
Hgb urine dipstick: NEGATIVE
Ketones, ur: NEGATIVE mg/dL
Leukocytes,Ua: NEGATIVE
Nitrite: NEGATIVE
Protein, ur: NEGATIVE mg/dL
Specific Gravity, Urine: >1.03 — ABNORMAL HIGH (ref 1.005–1.030)
pH: 6 (ref 5.0–8.0)

## 2020-02-05 LAB — PREGNANCY, URINE: Preg Test, Ur: NEGATIVE

## 2020-02-05 NOTE — ED Triage Notes (Signed)
Pt c/o irritation and swelling to vaginal area x 3-4 days.

## 2020-02-08 ENCOUNTER — Other Ambulatory Visit: Payer: Self-pay

## 2020-02-08 ENCOUNTER — Emergency Department (HOSPITAL_BASED_OUTPATIENT_CLINIC_OR_DEPARTMENT_OTHER)
Admission: EM | Admit: 2020-02-08 | Discharge: 2020-02-08 | Disposition: A | Discharge status: Home / Self Care | Payer: Self-pay | Attending: Emergency Medicine | Admitting: Emergency Medicine

## 2020-02-08 ENCOUNTER — Encounter (HOSPITAL_BASED_OUTPATIENT_CLINIC_OR_DEPARTMENT_OTHER): Payer: Self-pay

## 2020-02-08 DIAGNOSIS — Z202 Contact with and (suspected) exposure to infections with a predominantly sexual mode of transmission: Secondary | ICD-10-CM | POA: Insufficient documentation

## 2020-02-08 LAB — WET PREP, GENITAL
Clue Cells Wet Prep HPF POC: NONE SEEN
Trich, Wet Prep: NONE SEEN
Yeast Wet Prep HPF POC: NONE SEEN

## 2020-02-08 LAB — URINALYSIS, ROUTINE W REFLEX MICROSCOPIC
Bilirubin Urine: NEGATIVE
Glucose, UA: NEGATIVE mg/dL
Hgb urine dipstick: NEGATIVE
Ketones, ur: NEGATIVE mg/dL
Leukocytes,Ua: NEGATIVE
Nitrite: NEGATIVE
Protein, ur: NEGATIVE mg/dL
Specific Gravity, Urine: 1.03 (ref 1.005–1.030)
pH: 6 (ref 5.0–8.0)

## 2020-02-08 LAB — PREGNANCY, URINE: Preg Test, Ur: NEGATIVE

## 2020-02-08 MED ORDER — AZITHROMYCIN 250 MG PO TABS
1000.0000 mg | ORAL_TABLET | Freq: Once | ORAL | Status: AC
  Administered 2020-02-08: 11:00:00 1000 mg via ORAL
  Filled 2020-02-08: qty 4

## 2020-02-08 MED ORDER — LIDOCAINE HCL (PF) 1 % IJ SOLN
1.0000 mL | Freq: Once | INTRAMUSCULAR | Status: AC
Start: 2020-02-08 — End: 2020-02-08
  Administered 2020-02-08: 11:00:00 1 mL
  Filled 2020-02-08: qty 5

## 2020-02-08 MED ORDER — CEFTRIAXONE SODIUM 500 MG IJ SOLR
500.0000 mg | Freq: Once | INTRAMUSCULAR | Status: AC
  Administered 2020-02-08: 11:00:00 500 mg via INTRAMUSCULAR
  Filled 2020-02-08: qty 500

## 2020-02-08 NOTE — ED Triage Notes (Signed)
Complaining of vaginal irritation since Friday. States having more white discharge than normal, burning with urination and now some swelling to labia starting last night. Sexually active with female, no use of condoms, states chance of STDs.

## 2020-02-08 NOTE — ED Provider Notes (Signed)
[facility] EMERGENCY DEPARTMENT Provider Note   CSN: [id number] Arrival date & time: 02/08/20  0915     History Chief Complaint  Patient presents with  . Vaginal Itching    Rebecca Pitts is a 29 y.o. female.  Patient presents chief concern of vaginal itching.  She states that she has been having sex with 2 partners.  Last intercourse was about a week ago.  She found that one of them may have trichomonas and she is concerned she may have contracted the disease.  Patient has a history of gonorrhea and chlamydia several years ago.  Otherwise denies any vaginal discharge other than itching.  No fever no cough no vomiting or diarrhea.  Denies abdominal pain.        History reviewed. No pertinent past medical history.  There are no problems to display for this patient.   History reviewed. No pertinent surgical history.   OB History   No obstetric history on file.     History reviewed. No pertinent family history.  Social History   Tobacco Use  . Smoking status: Never Smoker  . Smokeless tobacco: Never Used  Vaping Use  . Vaping Use: Never used  Substance Use Topics  . Alcohol use: Yes    Comment: occ  . Drug use: Yes    Types: Marijuana    Home Medications Prior to Admission medications   Medication Sig Start Date End Date Taking? Authorizing Provider  doxycycline (VIBRAMYCIN) 100 MG capsule Take 1 capsule (100 mg total) by mouth 2 (two) times daily. 07/24/19   Benjiman Core, MD  HYDROcodone-acetaminophen (NORCO/VICODIN) 5-325 MG tablet Take 1-2 tablets by mouth every 6 (six) hours as needed. 12/11/19   Terrilee Files, MD  metroNIDAZOLE (FLAGYL) 500 MG tablet Take 1 tablet (500 mg total) by mouth 2 (two) times daily. 04/04/19   Henderly, Britni A, PA-C  tamsulosin (FLOMAX) 0.4 MG CAPS capsule Take 1 capsule (0.4 mg total) by mouth daily. 12/11/19   Terrilee Files, MD    Allergies    Patient has no known allergies.  Review of Systems    Review of Systems  Constitutional: Negative for fever.  HENT: Negative for ear pain.   Eyes: Negative for pain.  Respiratory: Negative for cough.   Cardiovascular: Negative for chest pain.  Gastrointestinal: Negative for abdominal pain.  Genitourinary: Negative for flank pain.  Musculoskeletal: Negative for back pain.  Skin: Negative for rash.  Neurological: Negative for headaches.    Physical Exam Updated Vital Signs BP 123/71 (BP Location: Right Arm)   Pulse 85   Temp 98.3 F (36.8 C) (Oral)   Resp 18   Ht 5\' 6"  (1.676 m) Comment: Simultaneous filing. User may not have seen previous data.  Wt 89.8 kg Comment: Simultaneous filing. User may not have seen previous data.  LMP 01/24/2020   SpO2 99%   BMI 31.96 kg/m   Physical Exam Constitutional:      General: She is not in acute distress.    Appearance: Normal appearance.  HENT:     Head: Normocephalic.     Nose: Nose normal.  Eyes:     Extraocular Movements: Extraocular movements intact.  Cardiovascular:     Rate and Rhythm: Normal rate.  Pulmonary:     Effort: Pulmonary effort is normal.  Abdominal:     Palpations: Abdomen is soft.     Tenderness: There is no abdominal tenderness.  Genitourinary:    General: Normal vulva.  Vagina: No vaginal discharge.     Comments: No significant discharge noted.  Wet mount sent.  Exam done with nursing chaperone present. Musculoskeletal:        General: Normal range of motion.     Cervical back: Normal range of motion.  Skin:    General: Skin is warm.  Neurological:     General: No focal deficit present.     Mental Status: She is alert.     ED Results / Procedures / Treatments   Labs (all labs ordered are listed, but only abnormal results are displayed) Labs Reviewed  WET PREP, GENITAL - Abnormal; Notable for the following components:      Result Value   WBC, Wet Prep HPF POC FEW (*)    All other components within normal limits  PREGNANCY, URINE  URINALYSIS,  ROUTINE W REFLEX MICROSCOPIC  GC/CHLAMYDIA PROBE AMP ([place]) NOT AT [facility]    EKG None  Radiology No results found.  Procedures Procedures (including critical care time)  Medications Ordered in ED Medications  azithromycin (ZITHROMAX) tablet 1,000 mg (has no administration in time range)  cefTRIAXone (ROCEPHIN) injection 500 mg (has no administration in time range)  lidocaine (PF) (XYLOCAINE) 1 % injection 1 mL (has no administration in time range)    ED Course  I have reviewed the triage vital signs and the nursing notes.  Pertinent labs & imaging results that were available during my care of the patient were reviewed by me and considered in my medical decision making (see chart for details).    MDM Rules/Calculators/A&P                          Chlamydia test sent and pending final result.  Patient treated empirically here in the ER.  Advised avoidance of sexual activity until STD test returns negative.  Advised follow-up with the primary care doctor within 3 to 4 days.  Advised immediate return for worsening pain fevers or any additional concerns.   Final Clinical Impression(s) / ED Diagnoses Final diagnoses:  Possible exposure to STD    Rx / DC Orders ED Discharge Orders    None       Cheryll Cockayne, MD 02/08/20 1123

## 2020-02-08 NOTE — Discharge Instructions (Addendum)
Call your primary care doctor or specialist as discussed in the next 2-3 days.   Return immediately back to the ER if:  Your symptoms worsen within the next 12-24 hours. You develop new symptoms such as new fevers, persistent vomiting, new pain, shortness of breath, or new weakness or numbness, or if you have any other concerns.  

## 2020-02-09 LAB — GC/CHLAMYDIA PROBE AMP (~~LOC~~) NOT AT ARMC
Chlamydia: NEGATIVE
Comment: NEGATIVE
Comment: NORMAL
Neisseria Gonorrhea: NEGATIVE

## 2020-11-05 ENCOUNTER — Emergency Department (HOSPITAL_BASED_OUTPATIENT_CLINIC_OR_DEPARTMENT_OTHER)
Admission: EM | Admit: 2020-11-05 | Discharge: 2020-11-06 | Disposition: A | Discharge status: Home / Self Care | Payer: No Typology Code available for payment source | Attending: Emergency Medicine | Admitting: Emergency Medicine

## 2020-11-05 ENCOUNTER — Encounter (HOSPITAL_BASED_OUTPATIENT_CLINIC_OR_DEPARTMENT_OTHER): Payer: Self-pay | Admitting: *Deleted

## 2020-11-05 ENCOUNTER — Other Ambulatory Visit: Payer: Self-pay

## 2020-11-05 ENCOUNTER — Emergency Department (HOSPITAL_BASED_OUTPATIENT_CLINIC_OR_DEPARTMENT_OTHER): Payer: No Typology Code available for payment source

## 2020-11-05 DIAGNOSIS — M545 Low back pain, unspecified: Secondary | ICD-10-CM | POA: Insufficient documentation

## 2020-11-05 DIAGNOSIS — Y92410 Unspecified street and highway as the place of occurrence of the external cause: Secondary | ICD-10-CM | POA: Diagnosis not present

## 2020-11-05 DIAGNOSIS — M542 Cervicalgia: Secondary | ICD-10-CM | POA: Insufficient documentation

## 2020-11-05 LAB — PREGNANCY, URINE: Preg Test, Ur: NEGATIVE

## 2020-11-05 MED ORDER — ACETAMINOPHEN 500 MG PO TABS
1000.0000 mg | ORAL_TABLET | Freq: Once | ORAL | Status: DC
  Filled 2020-11-05: qty 2

## 2020-11-05 MED ORDER — TETANUS-DIPHTH-ACELL PERTUSSIS 5-2.5-18.5 LF-MCG/0.5 IM SUSY: 0.5000 mL | PREFILLED_SYRINGE | Freq: Once | INTRAMUSCULAR | Status: DC

## 2020-11-05 MED ORDER — IBUPROFEN 800 MG PO TABS
800.0000 mg | ORAL_TABLET | Freq: Once | ORAL | Status: DC
  Filled 2020-11-05: qty 1

## 2020-11-05 MED ORDER — TRANEXAMIC ACID 1000 MG/10ML IV SOLN: 500.0000 mg | Freq: Once | INTRAVENOUS | Status: DC

## 2020-11-05 MED ORDER — CEFAZOLIN SODIUM-DEXTROSE 2-4 GM/100ML-% IV SOLN: 2.0000 g | Freq: Once | INTRAVENOUS | Status: DC
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
## 2020-11-06 ENCOUNTER — Encounter (HOSPITAL_BASED_OUTPATIENT_CLINIC_OR_DEPARTMENT_OTHER): Payer: Self-pay | Admitting: *Deleted

## 2020-11-06 ENCOUNTER — Other Ambulatory Visit: Payer: Self-pay

## 2020-11-06 ENCOUNTER — Encounter (HOSPITAL_BASED_OUTPATIENT_CLINIC_OR_DEPARTMENT_OTHER): Payer: Self-pay | Admitting: Emergency Medicine

## 2020-11-06 ENCOUNTER — Emergency Department (HOSPITAL_BASED_OUTPATIENT_CLINIC_OR_DEPARTMENT_OTHER)
Admission: EM | Admit: 2020-11-06 | Discharge: 2020-11-06 | Disposition: A | Discharge status: Home / Self Care | Payer: No Typology Code available for payment source | Source: Home / Self Care

## 2020-11-06 DIAGNOSIS — Z5321 Procedure and treatment not carried out due to patient leaving prior to being seen by health care provider: Secondary | ICD-10-CM | POA: Insufficient documentation

## 2020-11-06 DIAGNOSIS — M542 Cervicalgia: Secondary | ICD-10-CM | POA: Insufficient documentation

## 2020-11-06 MED ORDER — NAPROXEN 375 MG PO TABS: 375.0000 mg | ORAL_TABLET | Freq: Two times a day (BID) | ORAL | 0 refills | Status: AC

## 2020-11-06 MED ORDER — METHOCARBAMOL 500 MG PO TABS: 500.0000 mg | ORAL_TABLET | Freq: Two times a day (BID) | ORAL | 0 refills | Status: AC
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
## 2021-08-27 ENCOUNTER — Other Ambulatory Visit: Payer: Self-pay

## 2021-08-27 ENCOUNTER — Encounter (HOSPITAL_BASED_OUTPATIENT_CLINIC_OR_DEPARTMENT_OTHER): Payer: Self-pay | Admitting: Emergency Medicine

## 2021-08-27 ENCOUNTER — Emergency Department (HOSPITAL_BASED_OUTPATIENT_CLINIC_OR_DEPARTMENT_OTHER)
Admission: EM | Admit: 2021-08-27 | Discharge: 2021-08-27 | Disposition: A | Discharge status: Home / Self Care | Payer: Self-pay | Attending: Emergency Medicine | Admitting: Emergency Medicine

## 2021-08-27 DIAGNOSIS — Z5321 Procedure and treatment not carried out due to patient leaving prior to being seen by health care provider: Secondary | ICD-10-CM | POA: Insufficient documentation

## 2021-08-27 DIAGNOSIS — R509 Fever, unspecified: Secondary | ICD-10-CM | POA: Insufficient documentation

## 2021-08-27 DIAGNOSIS — J029 Acute pharyngitis, unspecified: Secondary | ICD-10-CM | POA: Insufficient documentation

## 2021-08-27 DIAGNOSIS — R519 Headache, unspecified: Secondary | ICD-10-CM | POA: Insufficient documentation

## 2021-08-27 DIAGNOSIS — Z20822 Contact with and (suspected) exposure to covid-19: Secondary | ICD-10-CM | POA: Insufficient documentation

## 2021-08-27 LAB — SARS CORONAVIRUS 2 BY RT PCR: SARS Coronavirus 2 by RT PCR: NEGATIVE

## 2021-08-27 LAB — GROUP A STREP BY PCR: Group A Strep by PCR: NOT DETECTED

## 2021-08-27 MED ORDER — IBUPROFEN 200 MG PO TABS: 600.0000 mg | ORAL_TABLET | Freq: Once | ORAL | Status: DC

## 2021-08-27 NOTE — ED Triage Notes (Signed)
Headache and sore throat since yesterday.  No sick exposure.  Fever this am of 103, took tylenol at 0200.  Painful swallowing.

## 2021-08-29 ENCOUNTER — Emergency Department (HOSPITAL_BASED_OUTPATIENT_CLINIC_OR_DEPARTMENT_OTHER)
Admission: EM | Admit: 2021-08-29 | Discharge: 2021-08-29 | Disposition: A | Discharge status: Home / Self Care | Payer: Self-pay | Attending: Emergency Medicine | Admitting: Emergency Medicine

## 2021-08-29 ENCOUNTER — Encounter (HOSPITAL_BASED_OUTPATIENT_CLINIC_OR_DEPARTMENT_OTHER): Payer: Self-pay

## 2021-08-29 ENCOUNTER — Other Ambulatory Visit: Payer: Self-pay

## 2021-08-29 DIAGNOSIS — J029 Acute pharyngitis, unspecified: Secondary | ICD-10-CM

## 2021-08-29 DIAGNOSIS — J020 Streptococcal pharyngitis: Secondary | ICD-10-CM | POA: Insufficient documentation

## 2021-08-29 MED ORDER — PENICILLIN G BENZATHINE 1200000 UNIT/2ML IM SUSY
1.2000 10*6.[IU] | PREFILLED_SYRINGE | Freq: Once | INTRAMUSCULAR | Status: AC
  Administered 2021-08-29: 1.2 10*6.[IU] via INTRAMUSCULAR
  Filled 2021-08-29: qty 2

## 2021-08-29 MED ORDER — DEXAMETHASONE SODIUM PHOSPHATE 10 MG/ML IJ SOLN
10.0000 mg | Freq: Once | INTRAMUSCULAR | Status: AC
  Administered 2021-08-29: 10 mg via INTRAMUSCULAR
  Filled 2021-08-29: qty 1

## 2021-08-29 NOTE — ED Provider Notes (Signed)
[facility] EMERGENCY DEPARTMENT Provider Note   CSN: [id number] Arrival date & time: 08/29/21  0754     History  Chief Complaint  Patient presents with   Sore Throat    Rebecca Pitts is a 31 y.o. female.  Rebecca Pitts 31 year old female who presents with persistent sore throat for the past several days.  She tested negative for strep and COVID 2 days ago and left the ED without being seen.  She continues to have a sore throat but denies any other new or worsening symptoms.  Her fever has not returned since she was here 2 days ago.  She has been taking some over-the-counter TheraFlu, Mucinex, and sore throat spray.   Sore Throat Pertinent negatives include no chest pain, no abdominal pain and no shortness of breath.       Home Medications Prior to Admission medications   Medication Sig Start Date End Date Taking? Authorizing Provider  doxycycline (VIBRAMYCIN) 100 MG capsule Take 1 capsule (100 mg total) by mouth 2 (two) times daily. 07/24/19   Benjiman Core, MD  HYDROcodone-acetaminophen (NORCO/VICODIN) 5-325 MG tablet Take 1-2 tablets by mouth every 6 (six) hours as needed. 12/11/19   Terrilee Files, MD  methocarbamol (ROBAXIN) 500 MG tablet Take 1 tablet (500 mg total) by mouth 2 (two) times daily. 11/06/20   Palumbo, April, MD  metroNIDAZOLE (FLAGYL) 500 MG tablet Take 1 tablet (500 mg total) by mouth 2 (two) times daily. 04/04/19   Henderly, Britni A, PA-C  naproxen (NAPROSYN) 375 MG tablet Take 1 tablet (375 mg total) by mouth 2 (two) times daily. 11/06/20   Palumbo, April, MD  tamsulosin (FLOMAX) 0.4 MG CAPS capsule Take 1 capsule (0.4 mg total) by mouth daily. 12/11/19   Terrilee Files, MD      Allergies    Patient has no known allergies.    Review of Systems   Review of Systems  Constitutional:  Negative for fatigue and fever.  HENT:  Positive for sore throat. Negative for postnasal drip and trouble swallowing.   Eyes:  Negative for visual  disturbance.  Respiratory:  Negative for cough and shortness of breath.   Cardiovascular:  Negative for chest pain.  Gastrointestinal:  Negative for abdominal pain, diarrhea, nausea and vomiting.  Genitourinary:  Negative for difficulty urinating.  Musculoskeletal:  Negative for neck pain.  Neurological:  Negative for light-headedness.    Physical Exam Updated Vital Signs BP 120/76 (BP Location: Right Arm)   Pulse 88   Temp 98.8 F (37.1 C) (Oral)   Resp 18   LMP 07/30/2021   SpO2 100%  Physical Exam Vitals reviewed.  Constitutional:      General: She is not in acute distress. HENT:     Head: Normocephalic and atraumatic.     Nose: No rhinorrhea.     Mouth/Throat:     Pharynx: Uvula midline. Pharyngeal swelling (Symmetrical), oropharyngeal exudate and posterior oropharyngeal erythema present. No uvula swelling.     Tonsils: No tonsillar abscesses.  Eyes:     Conjunctiva/sclera: Conjunctivae normal.     Pupils: Pupils are equal, round, and reactive to light.  Cardiovascular:     Rate and Rhythm: Normal rate and regular rhythm.  Pulmonary:     Effort: Pulmonary effort is normal.     Breath sounds: Normal breath sounds.  Neurological:     Mental Status: She is alert.     ED Results / Procedures / Treatments   Labs (all labs ordered are  listed, but only abnormal results are displayed) Labs Reviewed - No data to display  EKG None  Radiology No results found.  Procedures Procedures    Medications Ordered in ED Medications  penicillin g benzathine (BICILLIN LA) 1200000 UNIT/2ML injection 1.2 Million Units (1.2 Million Units Intramuscular Given 08/29/21 0859)  dexamethasone (DECADRON) injection 10 mg (10 mg Intramuscular Given 08/29/21 0857)    ED Course/ Medical Decision Making/ A&P                           Medical Decision Making Lillah Standre is a 31 year old female who presents with persistent sore throat after negative strep and COVID test 2 days  ago.  She has obvious exudates and symmetrical swelling in her pharynx on exam.  Despite negative strep test we believe she has strep throat I will treat her with penicillin G and Decadron IM.  She is not have any signs of systemic infection or peritonsillar abscess.  Patient counseled symptoms to watch for that would necessitate repeat evaluation in the emergency department.  Problems Addressed: Sore throat: acute illness or injury Strep pharyngitis: acute illness or injury  Risk Prescription drug management.          Final Clinical Impression(s) / ED Diagnoses Final diagnoses:  Strep pharyngitis  Sore throat    Rx / DC Orders ED Discharge Orders     None         Rocky Morel, DO 08/29/21 7124    Tegeler, Canary Brim, MD 08/30/21 1558

## 2021-08-29 NOTE — ED Notes (Signed)
ED Provider at bedside. 

## 2021-08-29 NOTE — ED Triage Notes (Signed)
Pt reports sore throat since Monday, seen on Tuesday, tested for strep and covid, both negative, left before being seen due to wait time.  States throat remains painful to swallow.  Has not taken any medications today

## 2021-08-29 NOTE — Discharge Instructions (Addendum)
You were treated for strep throat with a steroid shot and antibiotic shot.  If you notice any new or worsening symptoms including fevers, trouble swallowing, nausea, vomiting, or anything bothersome please return to the emergency department or call your PCP.

## 2021-08-29 NOTE — ED Notes (Signed)
Pt feeling fine after medications. Ready for discharge.

## 2021-11-14 ENCOUNTER — Other Ambulatory Visit: Payer: Self-pay

## 2021-11-14 ENCOUNTER — Emergency Department (HOSPITAL_BASED_OUTPATIENT_CLINIC_OR_DEPARTMENT_OTHER)
Admission: EM | Admit: 2021-11-14 | Discharge: 2021-11-14 | Disposition: A | Discharge status: Home / Self Care | Payer: Self-pay | Attending: Emergency Medicine | Admitting: Emergency Medicine

## 2021-11-14 DIAGNOSIS — B9689 Other specified bacterial agents as the cause of diseases classified elsewhere: Secondary | ICD-10-CM

## 2021-11-14 DIAGNOSIS — R1030 Lower abdominal pain, unspecified: Secondary | ICD-10-CM | POA: Insufficient documentation

## 2021-11-14 DIAGNOSIS — N760 Acute vaginitis: Secondary | ICD-10-CM | POA: Insufficient documentation

## 2021-11-14 LAB — URINALYSIS, ROUTINE W REFLEX MICROSCOPIC
Bilirubin Urine: NEGATIVE
Glucose, UA: NEGATIVE mg/dL
Ketones, ur: NEGATIVE mg/dL
Leukocytes,Ua: NEGATIVE
Nitrite: NEGATIVE
Protein, ur: NEGATIVE mg/dL
Specific Gravity, Urine: 1.015 (ref 1.005–1.030)
pH: 6.5 (ref 5.0–8.0)

## 2021-11-14 LAB — COMPREHENSIVE METABOLIC PANEL
ALT: 21 U/L (ref 0–44)
AST: 24 U/L (ref 15–41)
Albumin: 3.7 g/dL (ref 3.5–5.0)
Alkaline Phosphatase: 75 U/L (ref 38–126)
Anion gap: 7 (ref 5–15)
BUN: 7 mg/dL (ref 6–20)
CO2: 24 mmol/L (ref 22–32)
Calcium: 8.9 mg/dL (ref 8.9–10.3)
Chloride: 107 mmol/L (ref 98–111)
Creatinine, Ser: 0.74 mg/dL (ref 0.44–1.00)
GFR, Estimated: >60 mL/min (ref >60)
Glucose, Bld: 114 mg/dL — ABNORMAL HIGH (ref 70–99)
Potassium: 4 mmol/L (ref 3.5–5.1)
Sodium: 138 mmol/L (ref 135–145)
Total Bilirubin: 0.3 mg/dL (ref 0.3–1.2)
Total Protein: 7.2 g/dL (ref 6.5–8.1)

## 2021-11-14 LAB — CBC WITH DIFFERENTIAL/PLATELET
Abs Immature Granulocytes: 0.03 10*3/uL (ref 0.00–0.07)
Basophils Absolute: 0 10*3/uL (ref 0.0–0.1)
Basophils Relative: 0 %
Eosinophils Absolute: 0 10*3/uL (ref 0.0–0.5)
Eosinophils Relative: 1 %
HCT: 36 % (ref 36.0–46.0)
Hemoglobin: 11.9 g/dL — ABNORMAL LOW (ref 12.0–15.0)
Immature Granulocytes: 1 %
Lymphocytes Relative: 30 %
Lymphs Abs: 2 10*3/uL (ref 0.7–4.0)
MCH: 27.9 pg (ref 26.0–34.0)
MCHC: 33.1 g/dL (ref 30.0–36.0)
MCV: 84.3 fL (ref 80.0–100.0)
Monocytes Absolute: 0.3 10*3/uL (ref 0.1–1.0)
Monocytes Relative: 5 %
Neutro Abs: 4.2 10*3/uL (ref 1.7–7.7)
Neutrophils Relative %: 63 %
Platelets: 259 10*3/uL (ref 150–400)
RBC: 4.27 MIL/uL (ref 3.87–5.11)
RDW: 14.3 % (ref 11.5–15.5)
WBC: 6.6 10*3/uL (ref 4.0–10.5)
nRBC: 0 % (ref 0.0–0.2)

## 2021-11-14 LAB — URINALYSIS, MICROSCOPIC (REFLEX)

## 2021-11-14 LAB — LIPASE, BLOOD: Lipase: 35 U/L (ref 11–51)

## 2021-11-14 LAB — WET PREP, GENITAL
Sperm: NONE SEEN
Trich, Wet Prep: NONE SEEN
WBC, Wet Prep HPF POC: <10 (ref <10)
Yeast Wet Prep HPF POC: NONE SEEN

## 2021-11-14 LAB — PREGNANCY, URINE: Preg Test, Ur: NEGATIVE

## 2021-11-14 MED ORDER — METRONIDAZOLE 500 MG PO TABS: 500.0000 mg | ORAL_TABLET | Freq: Two times a day (BID) | ORAL | 0 refills | Status: AC

## 2021-11-14 MED ORDER — METRONIDAZOLE 500 MG PO TABS
500.0000 mg | ORAL_TABLET | Freq: Once | ORAL | Status: AC
  Administered 2021-11-14: 500 mg via ORAL
  Filled 2021-11-14: qty 1

## 2021-11-14 MED ORDER — METRONIDAZOLE 500 MG PO TABS: 500.0000 mg | ORAL_TABLET | Freq: Two times a day (BID) | ORAL | 0 refills | Status: DC

## 2021-11-14 NOTE — ED Provider Notes (Signed)
[facility] EMERGENCY DEPARTMENT Provider Note   CSN: [id number] Arrival date & time: 11/14/21  1834     History  Chief Complaint  Patient presents with   Abdominal Pain    Rebecca Pitts is a 31 y.o. female.  Patient here with lower abdominal irritation, may be vaginal discharge, pain with urination.  Nothing makes it better or worse.  No history of kidney stones.  No history of major abdominal surgeries.  Menstrual cycle last week.  Denies any fevers or chills.  No concern for STDs otherwise.  The history is provided by the patient.       Home Medications Prior to Admission medications   Medication Sig Start Date End Date Taking? Authorizing Provider  metroNIDAZOLE (FLAGYL) 500 MG tablet Take 1 tablet (500 mg total) by mouth 2 (two) times daily for 7 days. 11/14/21 11/21/21 Yes Rebecca Herrig, DO  doxycycline (VIBRAMYCIN) 100 MG capsule Take 1 capsule (100 mg total) by mouth 2 (two) times daily. 07/24/19   Rebecca Belling, MD  HYDROcodone-acetaminophen (NORCO/VICODIN) 5-325 MG tablet Take 1-2 tablets by mouth every 6 (six) hours as needed. 12/11/19   Rebecca Rasmussen, MD  methocarbamol (ROBAXIN) 500 MG tablet Take 1 tablet (500 mg total) by mouth 2 (two) times daily. 11/06/20   Pitts, April, MD  naproxen (NAPROSYN) 375 MG tablet Take 1 tablet (375 mg total) by mouth 2 (two) times daily. 11/06/20   Pitts, April, MD  tamsulosin (FLOMAX) 0.4 MG CAPS capsule Take 1 capsule (0.4 mg total) by mouth daily. 12/11/19   Rebecca Rasmussen, MD      Allergies    Patient has no known allergies.    Review of Systems   Review of Systems  Physical Exam Updated Vital Signs BP (!) 121/94   Pulse 82   Temp 98.5 F (36.9 C) (Oral)   Resp 16   Ht 5\' 6"  (1.676 m)   Wt 87.1 kg   LMP 11/07/2021   SpO2 100%   BMI 30.99 kg/m  Physical Exam Vitals and nursing note reviewed.  Constitutional:      General: She is not in acute distress.    Appearance: She is  well-developed.  HENT:     Head: Normocephalic and atraumatic.  Eyes:     Conjunctiva/sclera: Conjunctivae normal.  Cardiovascular:     Rate and Rhythm: Normal rate and regular rhythm.     Heart sounds: Normal heart sounds. No murmur heard. Pulmonary:     Effort: Pulmonary effort is normal. No respiratory distress.     Breath sounds: Normal breath sounds.  Abdominal:     Palpations: Abdomen is soft.     Tenderness: There is no abdominal tenderness.  Musculoskeletal:        General: No swelling.     Cervical back: Neck supple.  Skin:    General: Skin is warm and dry.     Capillary Refill: Capillary refill takes less than 2 seconds.  Neurological:     Mental Status: She is alert.  Psychiatric:        Mood and Affect: Mood normal.     ED Results / Procedures / Treatments   Labs (all labs ordered are listed, but only abnormal results are displayed) Labs Reviewed  WET PREP, GENITAL - Abnormal; Notable for the following components:      Result Value   Clue Cells Wet Prep HPF POC PRESENT (*)    All other components within normal limits  CBC WITH DIFFERENTIAL/PLATELET -  Abnormal; Notable for the following components:   Hemoglobin 11.9 (*)    All other components within normal limits  COMPREHENSIVE METABOLIC PANEL - Abnormal; Notable for the following components:   Glucose, Bld 114 (*)    All other components within normal limits  URINALYSIS, ROUTINE W REFLEX MICROSCOPIC - Abnormal; Notable for the following components:   Hgb urine dipstick TRACE (*)    All other components within normal limits  URINALYSIS, MICROSCOPIC (REFLEX) - Abnormal; Notable for the following components:   Bacteria, UA FEW (*)    Non Squamous Epithelial PRESENT (*)    All other components within normal limits  LIPASE, BLOOD  PREGNANCY, URINE  GC/CHLAMYDIA PROBE AMP ([place]) NOT AT [facility]    EKG None  Radiology No results found.  Procedures Procedures    Medications Ordered in  ED Medications  metroNIDAZOLE (FLAGYL) tablet 500 mg (500 mg Oral Given 11/14/21 2059)    ED Course/ Medical Decision Making/ A&P                           Medical Decision Making Amount and/or Complexity of Data Reviewed Labs: ordered.  Risk Prescription drug management.   Rebecca Pitts is here with pain with urination, vaginal discharge.  She has no abdominal tenderness on exam.  She is very well-appearing.  Differential diagnosis is UTI versus BV versus STD.  Have no concern for PID as she has no abdominal pain very well-appearing.  Urinalysis is negative for infection.  Patient with clue cells and positive for bacterial vaginosis.  Pregnancy test is negative.  Blood work is otherwise unremarkable.My suspicion is that patient has bacterial vaginosis that is causing some discomfort.  Will treat with Flagyl.  She will follow-up gonorrhea and Chlamydia test online.  Components was negative.  Have no concern for appendicitis or other acute intra-abdominal process.  Discharged in good condition.  This chart was dictated using voice recognition software.  Despite best efforts to proofread,  errors can occur which can change the documentation meaning.         Final Clinical Impression(s) / ED Diagnoses Final diagnoses:  BV (bacterial vaginosis)    Rx / DC Orders ED Discharge Orders          Ordered    metroNIDAZOLE (FLAGYL) 500 MG tablet  2 times daily        11/14/21 2129              Rebecca Sites, DO 11/14/21 2131

## 2021-11-14 NOTE — Discharge Instructions (Signed)
Urinalysis is negative for infection.  Overall suspect her symptoms are from bacterial vaginosis.  Take your next dose antibiotic tomorrow.

## 2021-11-14 NOTE — ED Triage Notes (Signed)
Pt to ED c/o lower abdominal pain with urination. No abdominal pain when not urinating. Reports this has been ongoing x 5 days. Reports vaginal bleeding.

## 2021-11-15 ENCOUNTER — Emergency Department (HOSPITAL_COMMUNITY)
Admission: EM | Admit: 2021-11-15 | Discharge: 2021-11-15 | Disposition: A | Discharge status: Home / Self Care | Payer: Self-pay | Attending: Emergency Medicine | Admitting: Emergency Medicine

## 2021-11-15 ENCOUNTER — Encounter (HOSPITAL_COMMUNITY): Payer: Self-pay | Admitting: Emergency Medicine

## 2021-11-15 DIAGNOSIS — R300 Dysuria: Secondary | ICD-10-CM | POA: Insufficient documentation

## 2021-11-15 DIAGNOSIS — R109 Unspecified abdominal pain: Secondary | ICD-10-CM | POA: Insufficient documentation

## 2021-11-15 DIAGNOSIS — R319 Hematuria, unspecified: Secondary | ICD-10-CM | POA: Insufficient documentation

## 2021-11-15 LAB — GC/CHLAMYDIA PROBE AMP (~~LOC~~) NOT AT ARMC
Chlamydia: NEGATIVE
Comment: NEGATIVE
Comment: NORMAL
Neisseria Gonorrhea: NEGATIVE

## 2021-11-15 NOTE — ED Triage Notes (Signed)
C/io abd. Pain was seen at [facility] with some hematura had extensive workup

## 2021-11-15 NOTE — ED Provider Notes (Signed)
[facility] EMERGENCY DEPARTMENT Provider Note   CSN: [id number] Arrival date & time: 11/15/21  0831     History  Chief Complaint  Patient presents with   Abdominal Pain    Rebecca Pitts is a 31 y.o. female.  31 year old female presents with complaint of dysuria with hematuria for the past few days.  Patient went to med [facility] last night, had work-up, diagnosed with BV and prescribed Flagyl.  Patient is concerned something else is going on and presents today for evaluation. Denies nausea, vomiting, changes in bowel habits.        Home Medications Prior to Admission medications   Medication Sig Start Date End Date Taking? Authorizing Provider  doxycycline (VIBRAMYCIN) 100 MG capsule Take 1 capsule (100 mg total) by mouth 2 (two) times daily. 07/24/19   Benjiman Core, MD  HYDROcodone-acetaminophen (NORCO/VICODIN) 5-325 MG tablet Take 1-2 tablets by mouth every 6 (six) hours as needed. 12/11/19   Terrilee Files, MD  methocarbamol (ROBAXIN) 500 MG tablet Take 1 tablet (500 mg total) by mouth 2 (two) times daily. 11/06/20   Palumbo, April, MD  metroNIDAZOLE (FLAGYL) 500 MG tablet Take 1 tablet (500 mg total) by mouth 2 (two) times daily for 7 days. 11/14/21 11/21/21  Curatolo, Adam, DO  naproxen (NAPROSYN) 375 MG tablet Take 1 tablet (375 mg total) by mouth 2 (two) times daily. 11/06/20   Palumbo, April, MD  tamsulosin (FLOMAX) 0.4 MG CAPS capsule Take 1 capsule (0.4 mg total) by mouth daily. 12/11/19   Terrilee Files, MD      Allergies    Patient has no known allergies.    Review of Systems   Review of Systems Negative except as per HPI Physical Exam Updated Vital Signs BP 121/81 (BP Location: Right Arm)   Pulse (!) 114   Temp 99.1 F (37.3 C) (Oral)   Resp 16   LMP 11/07/2021   SpO2 100%  Physical Exam Vitals and nursing note reviewed.  Constitutional:      General: She is not in acute distress.    Appearance: She is  well-developed. She is not diaphoretic.  HENT:     Head: Normocephalic and atraumatic.  Pulmonary:     Effort: Pulmonary effort is normal.  Abdominal:     Tenderness: There is no abdominal tenderness. There is no right CVA tenderness or left CVA tenderness.  Skin:    General: Skin is warm and dry.  Neurological:     Mental Status: She is alert and oriented to person, place, and time.  Psychiatric:        Behavior: Behavior normal.     ED Results / Procedures / Treatments   Labs (all labs ordered are listed, but only abnormal results are displayed) Labs Reviewed - No data to display  EKG None  Radiology No results found.  Procedures Procedures    Medications Ordered in ED Medications - No data to display  ED Course/ Medical Decision Making/ A&P                           Medical Decision Making  This patient presents to the ED for concern of dysuria, hematuria, this involves an extensive number of treatment options, and is a complaint that carries with it a high risk of complications and morbidity.  The differential diagnosis includes but not limited to UTI, STD, stone   Co morbidities that complicate the patient evaluation  Kidney stones   Additional history obtained:  External records from outside source obtained and reviewed including labs and notes from ER visit dated 11/14/21   Lab Tests:  I Ordered, and personally interpreted labs.  The pertinent results include:  results from last night's ER visit reviewed-no evidence of UTI, normal renal function, normal white count   Problem List / ED Course / Critical interventions / Medication management  31 year old female with concern for dysuria and hematuria as above.  Notes that her pain does occasionally come into her right lower back.  Thorough work-up in the ER last night including labs with diagnosis of BV, treated with Flagyl.  Send out GC chlamydia which is pending.  Offered pelvic exam in the ER, do not  feel it is necessary to repeat labs as they were obtained just over 12 hours ago.  Consider could possibly have a small stone as she did have punctate stones on CT obtained 2 years ago however with normal labs, not felt necessary to repeat CT/radiation dose at this time.  Given referral for GYN, encouraged to take Flagyl, return to ER as needed. I have reviewed the patients home medicines and have made adjustments as needed   Social Determinants of Health:  Does not have GYN provider, no PCP on file, provided with GYN resource guide   Test / Admission - Considered:  Consider pelvic exam, consider CT without contrast for possible stones given stone seen on 11/2019 CT however with normal renal function, normal WBC, urine without infection, not felt necessary radiation exposure at this time.         Final Clinical Impression(s) / ED Diagnoses Final diagnoses:  Dysuria    Rx / DC Orders ED Discharge Orders     None         Tacy Learn, PA-C 11/15/21 2423    Fredia Sorrow, MD 11/26/21 1006

## 2021-11-15 NOTE — Discharge Instructions (Signed)
Take Flagyl as prescribed. Follow up as discussed. Return to ER for worsening or concerning symptoms.   You can make an appointment to see a GYN provider:   [facility] [place] at [place]  [phone]   [facility] [place] at [facility]  [place]  [phone]   [facility] [place] at [place]  [phone]   [facility] [place] at [employer] [facility]  [place]  ([place] for [employer] at [place]  [phone]   If you already have an established OB/GYN provider in the area you can make an appointment with them as well.

## 2021-11-15 NOTE — ED Notes (Signed)
All assessment was done by Ophelia Charter rN

## 2021-11-15 NOTE — ED Provider Triage Note (Signed)
Emergency Medicine Provider Triage Evaluation Note  Rebecca Pitts , a 31 y.o. female  was evaluated in triage.  Pt complains of dysuria and hematuria onset a few days ago.  Went to [facility] last night.  Had complete work-up including labs, wet prep, send out GC chlamydia.  Urinalysis was negative, wet prep positive for BV for which she was provided with Flagyl.  Patient has not started this medication.  Presents today for further work-up..  Review of Systems  Positive: As above Negative:   Physical Exam  BP 121/81 (BP Location: Right Arm)   Pulse (!) 114   Temp 99.1 F (37.3 C) (Oral)   Resp 16   LMP 11/07/2021   SpO2 100%  Gen:   Awake, no distress   Resp:  Normal effort  MSK:   Moves extremities without difficulty  Other:    Medical Decision Making  Medically screening exam initiated at 8:56 AM.  Appropriate orders placed.  Rebecca Pitts was informed that the remainder of the evaluation will be completed by another provider, this initial triage assessment does not replace that evaluation, and the importance of remaining in the ED until their evaluation is complete.  Labs reviewed, obtained and resulted roughly 12 hours ago.  Do not see any indication to repeat any of these labs.  Discussed lab results with the patient.  Encourage patient to follow-up with her gynecologist.  Offered to have patient try her Flagyl and follow-up as needed.  Patient prefers to stay for reevaluation today.   Tacy Learn, PA-C 11/15/21 [phone]

## 2022-04-30 ENCOUNTER — Encounter (HOSPITAL_BASED_OUTPATIENT_CLINIC_OR_DEPARTMENT_OTHER): Payer: Self-pay | Admitting: Emergency Medicine

## 2022-04-30 ENCOUNTER — Other Ambulatory Visit: Payer: Self-pay

## 2022-04-30 ENCOUNTER — Emergency Department (HOSPITAL_BASED_OUTPATIENT_CLINIC_OR_DEPARTMENT_OTHER): Payer: Self-pay

## 2022-04-30 ENCOUNTER — Emergency Department (HOSPITAL_BASED_OUTPATIENT_CLINIC_OR_DEPARTMENT_OTHER)
Admission: EM | Admit: 2022-04-30 | Discharge: 2022-04-30 | Disposition: A | Discharge status: Home / Self Care | Payer: Self-pay | Attending: Emergency Medicine | Admitting: Emergency Medicine

## 2022-04-30 DIAGNOSIS — O26851 Spotting complicating pregnancy, first trimester: Secondary | ICD-10-CM | POA: Insufficient documentation

## 2022-04-30 DIAGNOSIS — Z3A01 Less than 8 weeks gestation of pregnancy: Secondary | ICD-10-CM | POA: Insufficient documentation

## 2022-04-30 DIAGNOSIS — R109 Unspecified abdominal pain: Secondary | ICD-10-CM | POA: Insufficient documentation

## 2022-04-30 DIAGNOSIS — B9689 Other specified bacterial agents as the cause of diseases classified elsewhere: Secondary | ICD-10-CM

## 2022-04-30 DIAGNOSIS — O468X1 Other antepartum hemorrhage, first trimester: Secondary | ICD-10-CM

## 2022-04-30 DIAGNOSIS — O26891 Other specified pregnancy related conditions, first trimester: Secondary | ICD-10-CM | POA: Insufficient documentation

## 2022-04-30 LAB — WET PREP, GENITAL
Sperm: NONE SEEN
Trich, Wet Prep: NONE SEEN
WBC, Wet Prep HPF POC: <10 (ref <10)
Yeast Wet Prep HPF POC: NONE SEEN

## 2022-04-30 LAB — URINALYSIS, ROUTINE W REFLEX MICROSCOPIC
Bilirubin Urine: NEGATIVE
Glucose, UA: NEGATIVE mg/dL
Hgb urine dipstick: NEGATIVE
Ketones, ur: NEGATIVE mg/dL
Leukocytes,Ua: NEGATIVE
Nitrite: NEGATIVE
Protein, ur: NEGATIVE mg/dL
Specific Gravity, Urine: 1.025 (ref 1.005–1.030)
pH: 7 (ref 5.0–8.0)

## 2022-04-30 LAB — PREGNANCY, URINE: Preg Test, Ur: POSITIVE — AB

## 2022-04-30 LAB — HIV ANTIBODY (ROUTINE TESTING W REFLEX): HIV Screen 4th Generation wRfx: NONREACTIVE

## 2022-04-30 LAB — HCG, QUANTITATIVE, PREGNANCY: hCG, Beta Chain, Quant, S: 36648 m[IU]/mL — ABNORMAL HIGH (ref <5)

## 2022-04-30 MED ORDER — METRONIDAZOLE 0.75 % EX GEL: 1.0000 | Freq: Every day | CUTANEOUS | 0 refills | Status: AC

## 2022-04-30 NOTE — ED Notes (Signed)
Patient transported to Ultrasound 

## 2022-04-30 NOTE — ED Provider Notes (Signed)
[facility] Provider Note   CSN: [id number] Arrival date & time: 04/30/22  [id number]     History  Chief Complaint  Patient presents with   Vaginal Bleeding    Rebecca Pitts is a 32 y.o. female b presents emergency department with chief complaint of vaginal bleeding.  Patient is G2, P1.  She estimates that she is approximately [redacted] weeks pregnant.  LMP Jan 22,2024 Patient states that 3 days ago she noticed a little bit of pink spotting.  She had recurrence of the same today.  She has a little bit of abdominal cramping.  She has no other significant concerns today.  She denies any foul odor, dyspareunia, abnormal discharge other than the pink spotting.  Patient also states that her bowel movements have also slowed down.  She is having 3 bowel movements daily and not having as many.  She denies nausea vomiting or constipation.  She is not taking any current prenatal vitamins.   Vaginal Bleeding      Home Medications Prior to Admission medications   Medication Sig Start Date End Date Taking? Authorizing Provider  doxycycline (VIBRAMYCIN) 100 MG capsule Take 1 capsule (100 mg total) by mouth 2 (two) times daily. 07/24/19   Davonna Belling, MD  HYDROcodone-acetaminophen (NORCO/VICODIN) 5-325 MG tablet Take 1-2 tablets by mouth every 6 (six) hours as needed. 12/11/19   Hayden Rasmussen, MD  methocarbamol (ROBAXIN) 500 MG tablet Take 1 tablet (500 mg total) by mouth 2 (two) times daily. 11/06/20   Palumbo, April, MD  naproxen (NAPROSYN) 375 MG tablet Take 1 tablet (375 mg total) by mouth 2 (two) times daily. 11/06/20   Palumbo, April, MD  tamsulosin (FLOMAX) 0.4 MG CAPS capsule Take 1 capsule (0.4 mg total) by mouth daily. 12/11/19   Hayden Rasmussen, MD      Allergies    Patient has no known allergies.    Review of Systems   Review of Systems  Genitourinary:  Positive for vaginal bleeding.    Physical Exam Updated Vital Signs BP 125/76  (BP Location: Left Arm)   Pulse 75   Temp 98.6 F (37 C) (Oral)   Resp 18   LMP 03/15/2022 (Exact Date)   SpO2 99%  Physical Exam Vitals and nursing note reviewed.  Constitutional:      General: She is not in acute distress.    Appearance: She is well-developed. She is not diaphoretic.  HENT:     Head: Normocephalic and atraumatic.     Right Ear: External ear normal.     Left Ear: External ear normal.     Nose: Nose normal.     Mouth/Throat:     Mouth: Mucous membranes are moist.  Eyes:     General: No scleral icterus.    Conjunctiva/sclera: Conjunctivae normal.  Cardiovascular:     Rate and Rhythm: Normal rate and regular rhythm.     Heart sounds: Normal heart sounds. No murmur heard.    No friction rub. No gallop.  Pulmonary:     Effort: Pulmonary effort is normal. No respiratory distress.     Breath sounds: Normal breath sounds.  Abdominal:     General: Bowel sounds are normal. There is no distension.     Palpations: Abdomen is soft. There is no mass.     Tenderness: There is no abdominal tenderness. There is no guarding.  Genitourinary:    Comments: Pelvic exam: normal external genitalia, vulva, vagina, cervix, uterus and  adnexa  Musculoskeletal:     Cervical back: Normal range of motion.  Skin:    General: Skin is warm and dry.  Neurological:     Mental Status: She is alert and oriented to person, place, and time.  Psychiatric:        Behavior: Behavior normal.     ED Results / Procedures / Treatments   Labs (all labs ordered are listed, but only abnormal results are displayed) Labs Reviewed  WET PREP, GENITAL  URINALYSIS, ROUTINE W REFLEX MICROSCOPIC  RPR  HIV ANTIBODY (ROUTINE TESTING W REFLEX)  HCG, QUANTITATIVE, PREGNANCY  GC/CHLAMYDIA PROBE AMP ([place]) NOT AT [facility]    EKG None  Radiology No results found.  Procedures Procedures    Medications Ordered in ED Medications - No data to display  ED Course/ Medical Decision Making/  A&P Clinical Course as of 04/30/22 1811  Wed Apr 30, 2022  1131 Clue Cells Wet Prep HPF POC(!): PRESENT [AH]  1131 Preg Test, Ur(!): POSITIVE [AH]    Clinical Course User Index [AH] Margarita Mail, PA-C                             Medical Decision Making 32 year old female who presents emergency department for bleeding in early pregnanc. The differential diagnosis for vaginal bleeding in pregnancy less than 20 weeks includes but is not limited to the following: Ectopic pregnancy, Subchorionic hematoma, First Trimester Abortion, Gestational trophoblastic disease, Heterotopic pregnancy, Implantation bleeding, Molar pregnancy, Cervicitis, Fibroids, Vaginal Trauma  I ordered and reviewed labs.  Patient hCG almost 40,000 and consistent with estimated gestational age, wet prep shows clue cells present, urine negative for infection, positive pregnancy test.  I ordered visualized and interpreted OB ultrasound which shows single IUP with fetal heart rate of 140, there is a small subchorionic hemorrhage noted.  I discussed all findings with the patient at bedside. Plan: Will treat the patient's bacterial vaginosis with MetroGel. 2 patient is to practice pelvic rest including no sexual activity or anything in the vagina, avoiding lifting heavy objects and close follow-up with OB/GYN 3 patient is to begin taking prenatal vitamins.  Discussed outpatient follow-up and return precautions the patient appears otherwise appropriate for discharge at this time  Amount and/or Complexity of Data Reviewed Labs: ordered. Decision-making details documented in ED Course. Radiology: ordered.  Risk Prescription drug management.           Final Clinical Impression(s) / ED Diagnoses Final diagnoses:  None    Rx / DC Orders ED Discharge Orders     None         Margarita Mail, PA-C 04/30/22 [place], [place], DO 05/01/22 [phone]

## 2022-04-30 NOTE — Discharge Instructions (Addendum)
You should not have sex or use tampons until you follow-up with OB/GYN. Should not lift anything over 10 pounds..  Get help right away if: You have severe cramps in your stomach, back, abdomen, or pelvis. You pass large clots or tissue. Save any tissue for your health care provider to look at. You faint. You become light-headed or weak.
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
## 2022-05-01 LAB — RPR: RPR Ser Ql: NONREACTIVE
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
## 2023-01-29 ENCOUNTER — Encounter (HOSPITAL_BASED_OUTPATIENT_CLINIC_OR_DEPARTMENT_OTHER): Payer: Self-pay | Admitting: Urology

## 2023-01-29 ENCOUNTER — Other Ambulatory Visit: Payer: Self-pay

## 2023-01-29 ENCOUNTER — Emergency Department (HOSPITAL_BASED_OUTPATIENT_CLINIC_OR_DEPARTMENT_OTHER)
Admission: EM | Admit: 2023-01-29 | Discharge: 2023-01-29 | Disposition: A | Discharge status: Home / Self Care | Payer: Self-pay | Attending: Emergency Medicine | Admitting: Emergency Medicine

## 2023-01-29 DIAGNOSIS — Z202 Contact with and (suspected) exposure to infections with a predominantly sexual mode of transmission: Secondary | ICD-10-CM | POA: Insufficient documentation

## 2023-01-29 LAB — URINALYSIS, ROUTINE W REFLEX MICROSCOPIC
Bilirubin Urine: NEGATIVE
Glucose, UA: NEGATIVE mg/dL
Hgb urine dipstick: NEGATIVE
Ketones, ur: NEGATIVE mg/dL
Leukocytes,Ua: NEGATIVE
Nitrite: NEGATIVE
Protein, ur: NEGATIVE mg/dL
Specific Gravity, Urine: 1.01 (ref 1.005–1.030)
pH: 7 (ref 5.0–8.0)

## 2023-01-29 LAB — PREGNANCY, URINE: Preg Test, Ur: NEGATIVE

## 2023-01-29 LAB — WET PREP, GENITAL
Clue Cells Wet Prep HPF POC: NONE SEEN
Sperm: NONE SEEN
Trich, Wet Prep: NONE SEEN
WBC, Wet Prep HPF POC: <10 (ref <10)
Yeast Wet Prep HPF POC: NONE SEEN

## 2023-01-29 LAB — HIV ANTIBODY (ROUTINE TESTING W REFLEX): HIV Screen 4th Generation wRfx: NONREACTIVE

## 2023-01-29 NOTE — ED Provider Notes (Signed)
Care of patient received from prior provider at 6:27 PM, please see their note for complete H/P and care plan.  Received handoff per ED course.     Reassessment: Wet prep negative.  Patient to follow-up other GC testing asynchronously since she does not have confirmatory exposure and is asymptomatic.     Glyn Ade, MD 01/29/23 1827

## 2023-01-29 NOTE — ED Provider Notes (Signed)
[place] [facility] AT [facility] Provider Note   CSN: [id number] Arrival date & time: 01/29/23  1624     History  Chief Complaint  Patient presents with   Exposure to STD    Rebecca Pitts is a 32 y.o. female.   Exposure to STD   32 year old female presents emergency department with complaints of vaginal discharge, dysuria.  Patient states she has been having symptoms for the past 3 days.  States that her sexual partner tested positive for trichomonas today and she is concerned about having the same.  Describes vaginal discharge as white.  Denies abdominal/pelvic pain, fever, change in bowel habits.  No significant pertinent past medical history.  Home Medications Prior to Admission medications   Medication Sig Start Date End Date Taking? Authorizing Provider  doxycycline (VIBRAMYCIN) 100 MG capsule Take 1 capsule (100 mg total) by mouth 2 (two) times daily. 07/24/19   Benjiman Core, MD  HYDROcodone-acetaminophen (NORCO/VICODIN) 5-325 MG tablet Take 1-2 tablets by mouth every 6 (six) hours as needed. 12/11/19   Terrilee Files, MD  methocarbamol (ROBAXIN) 500 MG tablet Take 1 tablet (500 mg total) by mouth 2 (two) times daily. 11/06/20   Palumbo, April, MD  naproxen (NAPROSYN) 375 MG tablet Take 1 tablet (375 mg total) by mouth 2 (two) times daily. 11/06/20   Palumbo, April, MD  tamsulosin (FLOMAX) 0.4 MG CAPS capsule Take 1 capsule (0.4 mg total) by mouth daily. 12/11/19   Terrilee Files, MD      Allergies    Patient has no known allergies.    Review of Systems   Review of Systems  All other systems reviewed and are negative.   Physical Exam Updated Vital Signs BP 112/77 (BP Location: Right Arm)   Pulse 90   Temp 98.2 F (36.8 C) (Oral)   Resp 16   Ht 5\' 6"  (1.676 m)   Wt 88.5 kg   LMP 01/01/2023 (Exact Date)   SpO2 100%   Breastfeeding Unknown   BMI 31.47 kg/m  Physical Exam Vitals and nursing note reviewed.   Constitutional:      General: She is not in acute distress.    Appearance: She is well-developed.  HENT:     Head: Normocephalic and atraumatic.  Eyes:     Conjunctiva/sclera: Conjunctivae normal.  Cardiovascular:     Rate and Rhythm: Normal rate and regular rhythm.     Heart sounds: No murmur heard. Pulmonary:     Effort: Pulmonary effort is normal. No respiratory distress.     Breath sounds: Normal breath sounds. No wheezing, rhonchi or rales.  Abdominal:     Palpations: Abdomen is soft.     Tenderness: There is no abdominal tenderness. There is no guarding.  Musculoskeletal:        General: No swelling.     Cervical back: Neck supple.  Skin:    General: Skin is warm and dry.     Capillary Refill: Capillary refill takes less than 2 seconds.  Neurological:     Mental Status: She is alert.  Psychiatric:        Mood and Affect: Mood normal.     ED Results / Procedures / Treatments   Labs (all labs ordered are listed, but only abnormal results are displayed) Labs Reviewed  WET PREP, GENITAL  URINALYSIS, ROUTINE W REFLEX MICROSCOPIC  PREGNANCY, URINE  HIV ANTIBODY (ROUTINE TESTING W REFLEX)  RPR  GC/CHLAMYDIA PROBE AMP ([place]) NOT AT [facility]  EKG None  Radiology No results found.  Procedures Procedures    Medications Ordered in ED Medications - No data to display  ED Course/ Medical Decision Making/ A&P                                 Medical Decision Making Amount and/or Complexity of Data Reviewed Labs: ordered.   This patient presents to the ED for concern of vaginal discharge, dysuria, this involves an extensive number of treatment options, and is a complaint that carries with it a high risk of complications and morbidity.  The differential diagnosis includes pneumonia, chlamydia, UTI, syphilis, HIV, trichomoniasis, BV , vulvovaginal candidiasis   Co morbidities that complicate the patient evaluation  See HPI   Additional history  obtained:  Additional history obtained from EMR External records from outside source obtained and reviewed including hospital records  Lab Tests:  I Ordered, and personally interpreted labs.  The pertinent results include: UA without abnormality.  Urine pregnancy negative.  GC/chlamydia, RPR, HIV pending.  Wet prep pending   Imaging Studies ordered:  N/a   Cardiac Monitoring: / EKG:  The patient was maintained on a cardiac monitor.  I personally viewed and interpreted the cardiac monitored which showed an underlying rhythm of: sinus rhythm   Consultations Obtained:  N/a   Problem List / ED Course / Critical interventions / Medication management  STD exposure  reevaluation of the patient showed that the patient stayed the same I have reviewed the patients home medicines and have made adjustments as needed   Social Determinants of Health:  Denies tobacco, licit drug use   Test / Admission - Considered:  STD exposure  Vitals signs within normal range and stable throughout visit. Laboratory/imaging studies significant for: See above 32 year old female presents emergency department with complaints of STD exposure.  Does report slight burning sensation when she is urinating as well as management discharge for the past 3 days.  States she has known exposure to trichomonas.  UA unremarkable.  Awaiting wet prep results and at shift change, patient care handed off to Dr. Doran Durand.  Patient stable upon shift change.         Final Clinical Impression(s) / ED Diagnoses Final diagnoses:  STD exposure    Rx / DC Orders ED Discharge Orders     None         Peter Garter, [place] 01/30/23 1610    Glyn Ade, MD 01/31/23 1506
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
## 2023-01-30 LAB — RPR: RPR Ser Ql: NONREACTIVE

## 2023-01-30 LAB — GC/CHLAMYDIA PROBE AMP (~~LOC~~) NOT AT ARMC
Chlamydia: NEGATIVE
Comment: NEGATIVE
Comment: NORMAL
Neisseria Gonorrhea: NEGATIVE

## 2023-10-21 ENCOUNTER — Other Ambulatory Visit: Payer: Self-pay

## 2023-10-21 ENCOUNTER — Emergency Department (HOSPITAL_BASED_OUTPATIENT_CLINIC_OR_DEPARTMENT_OTHER)
Admission: EM | Admit: 2023-10-21 | Discharge: 2023-10-21 | Disposition: A | Discharge status: Home / Self Care | Attending: Emergency Medicine | Admitting: Emergency Medicine

## 2023-10-21 ENCOUNTER — Emergency Department (HOSPITAL_BASED_OUTPATIENT_CLINIC_OR_DEPARTMENT_OTHER)

## 2023-10-21 ENCOUNTER — Encounter (HOSPITAL_BASED_OUTPATIENT_CLINIC_OR_DEPARTMENT_OTHER): Payer: Self-pay

## 2023-10-21 DIAGNOSIS — S3992XA Unspecified injury of lower back, initial encounter: Secondary | ICD-10-CM | POA: Diagnosis present

## 2023-10-21 DIAGNOSIS — Y92410 Unspecified street and highway as the place of occurrence of the external cause: Secondary | ICD-10-CM | POA: Insufficient documentation

## 2023-10-21 DIAGNOSIS — S39012A Strain of muscle, fascia and tendon of lower back, initial encounter: Secondary | ICD-10-CM | POA: Insufficient documentation

## 2023-10-21 LAB — PREGNANCY, URINE: Preg Test, Ur: NEGATIVE

## 2023-10-21 MED ORDER — CYCLOBENZAPRINE HCL 10 MG PO TABS: 10.0000 mg | ORAL_TABLET | Freq: Two times a day (BID) | ORAL | 0 refills | Status: AC | PRN

## 2023-10-21 MED ORDER — LIDOCAINE 5 % EX PTCH
1.0000 | MEDICATED_PATCH | CUTANEOUS | Status: DC
  Administered 2023-10-21: 1 via TRANSDERMAL
  Filled 2023-10-21: qty 1

## 2023-10-21 MED ORDER — KETOROLAC TROMETHAMINE 15 MG/ML IJ SOLN
15.0000 mg | Freq: Once | INTRAMUSCULAR | Status: AC
  Administered 2023-10-21: 15 mg via INTRAMUSCULAR
  Filled 2023-10-21: qty 1

## 2023-10-21 MED ORDER — KETOROLAC TROMETHAMINE 15 MG/ML IJ SOLN: 15.0000 mg | Freq: Once | INTRAMUSCULAR | Status: DC
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
## 2024-02-19 ENCOUNTER — Emergency Department (HOSPITAL_BASED_OUTPATIENT_CLINIC_OR_DEPARTMENT_OTHER)
Admission: EM | Admit: 2024-02-19 | Discharge: 2024-02-19 | Disposition: A | Discharge status: Home / Self Care | Payer: Self-pay | Attending: Emergency Medicine | Admitting: Emergency Medicine

## 2024-02-19 ENCOUNTER — Other Ambulatory Visit: Payer: Self-pay

## 2024-02-19 ENCOUNTER — Other Ambulatory Visit (HOSPITAL_BASED_OUTPATIENT_CLINIC_OR_DEPARTMENT_OTHER): Payer: Self-pay

## 2024-02-19 ENCOUNTER — Encounter (HOSPITAL_BASED_OUTPATIENT_CLINIC_OR_DEPARTMENT_OTHER): Payer: Self-pay

## 2024-02-19 DIAGNOSIS — Z202 Contact with and (suspected) exposure to infections with a predominantly sexual mode of transmission: Secondary | ICD-10-CM | POA: Insufficient documentation

## 2024-02-19 DIAGNOSIS — R300 Dysuria: Secondary | ICD-10-CM | POA: Insufficient documentation

## 2024-02-19 LAB — WET PREP, GENITAL
Clue Cells Wet Prep HPF POC: NONE SEEN
Sperm: NONE SEEN
Trich, Wet Prep: NONE SEEN
WBC, Wet Prep HPF POC: <10
Yeast Wet Prep HPF POC: NONE SEEN

## 2024-02-19 LAB — URINALYSIS, ROUTINE W REFLEX MICROSCOPIC
Bilirubin Urine: NEGATIVE
Glucose, UA: NEGATIVE mg/dL
Hgb urine dipstick: NEGATIVE
Ketones, ur: NEGATIVE mg/dL
Leukocytes,Ua: NEGATIVE
Nitrite: NEGATIVE
Protein, ur: NEGATIVE mg/dL
Specific Gravity, Urine: 1.015 (ref 1.005–1.030)
pH: 7 (ref 5.0–8.0)

## 2024-02-19 LAB — PREGNANCY, URINE: Preg Test, Ur: NEGATIVE

## 2024-02-19 LAB — HIV ANTIBODY (ROUTINE TESTING W REFLEX): HIV Screen 4th Generation wRfx: NONREACTIVE

## 2024-02-19 MED ORDER — CEFTRIAXONE SODIUM 500 MG IJ SOLR
500.0000 mg | INTRAMUSCULAR | Status: DC
  Administered 2024-02-19: 500 mg via INTRAMUSCULAR
  Filled 2024-02-19: qty 500

## 2024-02-19 MED ORDER — DOXYCYCLINE HYCLATE 100 MG PO CAPS
100.0000 mg | ORAL_CAPSULE | Freq: Two times a day (BID) | ORAL | 0 refills | Status: AC
  Filled 2024-02-19: qty 14, 7d supply, fill #0

## 2024-02-19 NOTE — ED Triage Notes (Signed)
 Pt states her sexual partner tested positive for an STD on Wednesday. He told her to get tested, but did not inform her what the positive test result was.   Endorses dysuria.

## 2024-02-19 NOTE — ED Provider Notes (Signed)
 " [place] [facility] Provider Note   CSN: [id number] Arrival date & time: 02/19/24  9250     Patient presents with: Dysuria   Rebecca Pitts is a 34 y.o. female.   HPI    34 year old female with no significant medical history presents with concern for STD exposure and dysuria.  She reports her partner called her and told her that he had tested positive for STD, but was not specific as to which one.  She has been having some dysuria.  Denies any vaginal discharge that is different from baseline.  Denies any abdominal pain, back pain, nausea, vomiting, fevers or other concerns.  Denies new soaps, bubble baths.  History reviewed. No pertinent past medical history.  Prior to Admission medications  Medication Sig Start Date End Date Taking? Authorizing Provider  doxycycline  (VIBRAMYCIN ) 100 MG capsule Take 1 capsule (100 mg total) by mouth 2 (two) times daily for 7 days. 02/19/24 02/26/24 Yes Dreama Longs, MD  cyclobenzaprine  (FLEXERIL ) 10 MG tablet Take 1 tablet (10 mg total) by mouth 2 (two) times daily as needed for muscle spasms. 10/21/23   Yolande Lamar BROCKS, MD  HYDROcodone -acetaminophen  (NORCO/VICODIN) 5-325 MG tablet Take 1-2 tablets by mouth every 6 (six) hours as needed. 12/11/19   Towana Ozell BROCKS, MD  methocarbamol  (ROBAXIN ) 500 MG tablet Take 1 tablet (500 mg total) by mouth 2 (two) times daily. 11/06/20   Palumbo, April, MD  naproxen  (NAPROSYN ) 375 MG tablet Take 1 tablet (375 mg total) by mouth 2 (two) times daily. 11/06/20   Palumbo, April, MD  tamsulosin  (FLOMAX ) 0.4 MG CAPS capsule Take 1 capsule (0.4 mg total) by mouth daily. 12/11/19   Towana Ozell BROCKS, MD    Allergies: Patient has no known allergies.    Review of Systems  Updated Vital Signs BP 121/77   Pulse 92   Temp 99.3 F (37.4 C)   Resp 18   LMP 01/27/2024 (Exact Date)   SpO2 98%   Breastfeeding Unknown   Physical Exam Vitals and nursing note reviewed.   Constitutional:      General: She is not in acute distress.    Appearance: Normal appearance. She is not ill-appearing, toxic-appearing or diaphoretic.  HENT:     Head: Normocephalic.  Eyes:     Conjunctiva/sclera: Conjunctivae normal.  Cardiovascular:     Rate and Rhythm: Normal rate and regular rhythm.     Pulses: Normal pulses.  Pulmonary:     Effort: Pulmonary effort is normal. No respiratory distress.  Abdominal:     Tenderness: There is no abdominal tenderness. There is no guarding.  Genitourinary:    Vagina: Vaginal discharge present.     Comments: No tenderness on pelvic exam. Musculoskeletal:        General: No deformity or signs of injury.     Cervical back: No rigidity.  Skin:    General: Skin is warm and dry.     Coloration: Skin is not jaundiced or pale.  Neurological:     General: No focal deficit present.     Mental Status: She is alert and oriented to person, place, and time.     (all labs ordered are listed, but only abnormal results are displayed) Labs Reviewed  WET PREP, GENITAL  URINALYSIS, ROUTINE W REFLEX MICROSCOPIC  PREGNANCY, URINE  HIV ANTIBODY (ROUTINE TESTING W REFLEX)  SYPHILIS: RPR W/REFLEX TO RPR TITER AND TREPONEMAL ANTIBODIES, TRADITIONAL SCREENING AND DIAGNOSIS ALGORITHM  GC/CHLAMYDIA PROBE AMP ([place])  NOT AT [facility]    EKG: None  Radiology: No results found.   Procedures   Medications Ordered in the ED  cefTRIAXone  (ROCEPHIN ) injection 500 mg (500 mg Intramuscular Given 02/19/24 0849)                                    Medical Decision Making Amount and/or Complexity of Data Reviewed Labs: ordered.  Risk Prescription drug management.     34 year old female with no significant medical history presents with concern for STD exposure and dysuria.    Pregnancy test is negative.  Urinalysis is without urinary tract infection.  Her history does not suggest chemical urethritis.  Discussed we can order HIV and syphilis  testing, however if she is able to speak with him again and finds out that it was HIV could consider post exposure prophylaxis and retesting.  Exam is not consistent with PID, or TOA.  Wet prep shows no abnormalities.   Will treat empirically for gonorrhea and chlamydia with Rocephin  and doxycycline .  Discussed she can follow-up the results of these tests on MyChart. Patient discharged in stable condition with understanding of reasons to return.     Final diagnoses:  Dysuria  STD exposure    ED Discharge Orders          Ordered    doxycycline  (VIBRAMYCIN ) 100 MG capsule  2 times daily        02/19/24 9170               Dreama Longs, MD 02/19/24 1011  "

## 2024-02-20 LAB — SYPHILIS: RPR W/REFLEX TO RPR TITER AND TREPONEMAL ANTIBODIES, TRADITIONAL SCREENING AND DIAGNOSIS ALGORITHM: RPR Ser Ql: NONREACTIVE

## 2024-02-22 LAB — GC/CHLAMYDIA PROBE AMP (~~LOC~~) NOT AT ARMC
Chlamydia: NEGATIVE
Comment: NEGATIVE
Comment: NORMAL
Neisseria Gonorrhea: NEGATIVE

## 2024-03-14 ENCOUNTER — Emergency Department (HOSPITAL_BASED_OUTPATIENT_CLINIC_OR_DEPARTMENT_OTHER)
Admission: EM | Admit: 2024-03-14 | Discharge: 2024-03-14 | Disposition: A | Discharge status: Home / Self Care | Payer: Self-pay

## 2024-03-14 ENCOUNTER — Encounter (HOSPITAL_BASED_OUTPATIENT_CLINIC_OR_DEPARTMENT_OTHER): Payer: Self-pay

## 2024-03-14 ENCOUNTER — Other Ambulatory Visit: Payer: Self-pay

## 2024-03-14 DIAGNOSIS — J029 Acute pharyngitis, unspecified: Secondary | ICD-10-CM | POA: Insufficient documentation

## 2024-03-14 LAB — RESP PANEL BY RT-PCR (RSV, FLU A&B, COVID)  RVPGX2
Influenza A by PCR: NEGATIVE
Influenza B by PCR: NEGATIVE
Resp Syncytial Virus by PCR: NEGATIVE
SARS Coronavirus 2 by RT PCR: NEGATIVE

## 2024-03-14 LAB — GROUP A STREP BY PCR: Group A Strep by PCR: NOT DETECTED

## 2024-03-14 MED ORDER — DEXAMETHASONE SOD PHOSPHATE PF 10 MG/ML IJ SOLN
10.0000 mg | Freq: Once | INTRAMUSCULAR | Status: AC
  Administered 2024-03-14: 10 mg via INTRAMUSCULAR
  Filled 2024-03-14: qty 1

## 2024-03-14 NOTE — ED Provider Notes (Signed)
 " [facility] Provider Note   CSN: [id number] Arrival date & time: 03/14/24  1528     Patient presents with: Sore Throat   Rebecca Pitts is a 34 y.o. female.  Patient is a 33 year old female with no significant medical history who presents to the ED for sore throat and cough for the past 3 days.  Patient notes that she noticed white patches on her throat earlier today.  States she has been using throat lozenges and drinking tea with minimal relief.  Has not taken any other medications.  Denies sick contacts.  Denies fever or difficulty breathing.  No further complaints.     Sore Throat Pertinent negatives include no chest pain and no shortness of breath.       Prior to Admission medications  Medication Sig Start Date End Date Taking? Authorizing Provider  cyclobenzaprine  (FLEXERIL ) 10 MG tablet Take 1 tablet (10 mg total) by mouth 2 (two) times daily as needed for muscle spasms. 10/21/23   Yolande Lamar BROCKS, MD  HYDROcodone -acetaminophen  (NORCO/VICODIN) 5-325 MG tablet Take 1-2 tablets by mouth every 6 (six) hours as needed. 12/11/19   Towana Ozell BROCKS, MD  methocarbamol  (ROBAXIN ) 500 MG tablet Take 1 tablet (500 mg total) by mouth 2 (two) times daily. 11/06/20   Palumbo, April, MD  naproxen  (NAPROSYN ) 375 MG tablet Take 1 tablet (375 mg total) by mouth 2 (two) times daily. 11/06/20   Palumbo, April, MD  tamsulosin  (FLOMAX ) 0.4 MG CAPS capsule Take 1 capsule (0.4 mg total) by mouth daily. 12/11/19   Towana Ozell BROCKS, MD    Allergies: Patient has no known allergies.    Review of Systems  Constitutional:  Negative for fever.  HENT:  Positive for congestion and sore throat.   Respiratory:  Positive for cough. Negative for shortness of breath.   Cardiovascular:  Negative for chest pain.  All other systems reviewed and are negative.   Updated Vital Signs BP 119/75 (BP Location: Right Arm)   Pulse 88   Temp 98.8 F (37.1 C)  (Oral)   Resp 16   LMP 01/27/2024 (Exact Date)   SpO2 100%   Physical Exam Constitutional:      Appearance: She is well-developed.  HENT:     Head: Normocephalic and atraumatic.     Mouth/Throat:     Mouth: Mucous membranes are moist.     Pharynx: Uvula midline.     Tonsils: Tonsillar exudate present. No tonsillar abscesses. 1+ on the right. 1+ on the left.     Comments: Uvula midline with no signs of abscess.  Mild tonsillar exudate present on the left.  No signs of abscess. Neck:     Comments: No cervical lymphadenopathy appreciated Pulmonary:     Effort: Pulmonary effort is normal.     Breath sounds: Normal breath sounds.  Musculoskeletal:     Cervical back: Normal range of motion and neck supple.  Lymphadenopathy:     Cervical: No cervical adenopathy.  Neurological:     Mental Status: She is alert and oriented to person, place, and time.  Psychiatric:        Mood and Affect: Mood normal.        Behavior: Behavior normal.     (all labs ordered are listed, but only abnormal results are displayed) Labs Reviewed  GROUP A STREP BY PCR  RESP PANEL BY RT-PCR (RSV, FLU A&B, COVID)  [id number]    EKG: None  Radiology: No results  found.    Medications Ordered in the ED  dexamethasone  (DECADRON ) injection 10 mg (has no administration in time range)                                   Medical Decision Making Patient is a 39 female who presents to the ED for sore throat and cough for the past 3 days.  Please see detailed HPI above.  On exam patient is alert and well-appearing.  Physical exam as noted above.  She does have tonsillar edema and mild exudate but uvula is midline with no signs of abscess.  No acute respiratory distress.  Vital signs stable.  Rapid strep and flu/COVID/RSV negative.  Suspect virus most likely cause the patient's symptoms.  I did offer mononucleosis testing the patient but she declines at this time.  I do find this is reasonable as this will not change  course of treatment as this is also a virus that has to run its course.  She is not in any contact sports or concerns for splenic injury.  Patient was given Decadron  while in ED for tonsillar edema.  Otherwise stable for discharge.  Symptomatic care discussed.  Advised PCP follow-up in 1 week if no improvement.  Return precautions provided.   Risk Prescription drug management.       Final diagnoses:  Viral pharyngitis    ED Discharge Orders     None          Neysa Thersia RAMAN, [place] 03/14/24 1757    Ula Prentice SAUNDERS, MD 03/14/24 2253  "

## 2024-03-14 NOTE — ED Notes (Signed)
 Reviewed discharge instructions, follow up, and medications with pt. Pt states understanding. Ambulatory at discharge

## 2024-03-14 NOTE — Discharge Instructions (Signed)
 The steroid should help with the swelling and inflammation over the next few days.  May take Tylenol  or ibuprofen  every 6 hours as needed for pain.  Honey, tea, throat lozenges, and gargling with warm salt water can help with sore throat as well.  Stay hydrated and drink plenty of fluids.  Follow-up with primary care doctor in 1 week if no improvement.  Return to ED if any symptoms worsen including severe swelling in the throat, uncontrolled fevers, difficulty breathing.

## 2024-03-14 NOTE — ED Triage Notes (Signed)
 Sore throat, congestion, fever for 3 days. White patches noted to back of throat   Denies difficulty breathing
